# Patient Record
Sex: Male | Born: 1950 | Race: Black or African American | Hispanic: No | Marital: Married | Smoking: Former smoker
Health system: Southern US, Community
[De-identification: ages and names within clinical notes are randomized; demographics above are authoritative.]

## PROBLEM LIST (undated history)

## (undated) DIAGNOSIS — I200 Unstable angina: Secondary | ICD-10-CM

## (undated) DIAGNOSIS — F1910 Other psychoactive substance abuse, uncomplicated: Secondary | ICD-10-CM

## (undated) DIAGNOSIS — R079 Chest pain, unspecified: Secondary | ICD-10-CM

## (undated) DIAGNOSIS — I10 Essential (primary) hypertension: Secondary | ICD-10-CM

## (undated) DIAGNOSIS — R0602 Shortness of breath: Secondary | ICD-10-CM

## (undated) DIAGNOSIS — I219 Acute myocardial infarction, unspecified: Secondary | ICD-10-CM

## (undated) DIAGNOSIS — K219 Gastro-esophageal reflux disease without esophagitis: Secondary | ICD-10-CM

## (undated) DIAGNOSIS — Z87891 Personal history of nicotine dependence: Secondary | ICD-10-CM

## (undated) DIAGNOSIS — S73015A Posterior dislocation of left hip, initial encounter: Secondary | ICD-10-CM

## (undated) DIAGNOSIS — E785 Hyperlipidemia, unspecified: Secondary | ICD-10-CM

## (undated) DIAGNOSIS — S9305XA Dislocation of left ankle joint, initial encounter: Secondary | ICD-10-CM

## (undated) HISTORY — DX: Chest pain, unspecified: R07.9

## (undated) HISTORY — DX: Essential (primary) hypertension: I10

## (undated) HISTORY — PX: TONSILLECTOMY: SUR1361

## (undated) HISTORY — DX: Personal history of nicotine dependence: Z87.891

## (undated) HISTORY — DX: Other psychoactive substance abuse, uncomplicated: F19.10

## (undated) HISTORY — DX: Unstable angina: I20.0

## (undated) HISTORY — DX: Gastro-esophageal reflux disease without esophagitis: K21.9

## (undated) HISTORY — DX: Shortness of breath: R06.02

## (undated) HISTORY — DX: Hyperlipidemia, unspecified: E78.5

---

## 2006-07-21 DIAGNOSIS — I219 Acute myocardial infarction, unspecified: Secondary | ICD-10-CM

## 2006-07-21 HISTORY — DX: Acute myocardial infarction, unspecified: I21.9

## 2010-04-12 ENCOUNTER — Encounter: Payer: Self-pay | Admitting: Physician Assistant

## 2010-04-12 ENCOUNTER — Encounter: Payer: Self-pay | Admitting: Cardiology

## 2010-04-12 ENCOUNTER — Ambulatory Visit: Payer: Self-pay | Admitting: Internal Medicine

## 2010-04-12 ENCOUNTER — Inpatient Hospital Stay (HOSPITAL_COMMUNITY): Admission: EM | Admit: 2010-04-12 | Discharge: 2010-04-16 | Payer: Self-pay | Admitting: Cardiology

## 2010-04-13 ENCOUNTER — Encounter: Payer: Self-pay | Admitting: Cardiology

## 2010-04-16 ENCOUNTER — Encounter: Payer: Self-pay | Admitting: Cardiology

## 2010-04-19 ENCOUNTER — Encounter: Payer: Self-pay | Admitting: Cardiology

## 2010-04-22 DIAGNOSIS — R0602 Shortness of breath: Secondary | ICD-10-CM

## 2010-04-22 DIAGNOSIS — Z87891 Personal history of nicotine dependence: Secondary | ICD-10-CM

## 2010-04-22 DIAGNOSIS — R079 Chest pain, unspecified: Secondary | ICD-10-CM

## 2010-04-23 ENCOUNTER — Ambulatory Visit: Payer: Self-pay | Admitting: Cardiology

## 2010-04-23 DIAGNOSIS — I10 Essential (primary) hypertension: Secondary | ICD-10-CM | POA: Insufficient documentation

## 2010-04-26 ENCOUNTER — Encounter (INDEPENDENT_AMBULATORY_CARE_PROVIDER_SITE_OTHER): Payer: Self-pay | Admitting: *Deleted

## 2010-05-06 ENCOUNTER — Telehealth (INDEPENDENT_AMBULATORY_CARE_PROVIDER_SITE_OTHER): Payer: Self-pay | Admitting: *Deleted

## 2010-08-20 NOTE — Consult Note (Signed)
Summary: CARDIOLOGY CONSULT/ [facility]  CARDIOLOGY CONSULT/ [facility]   Imported By: Zachary George 04/22/2010 16:11:16  _____________________________________________________________________  External Attachment:    Type:   Image     Comment:   External Document

## 2010-08-20 NOTE — Assessment & Plan Note (Signed)
Summary: [facility]-POST [facility] PER STUCKEY REQUEST   Visit Type:  Follow-up Primary Provider:  none   History of Present Illness: recently seen at [facility] with substernal chest pain. The patient ruled in for a non-ST elevation myocardial infarction and was referred for cardiac catheterization. Normal coronary arteries were demonstrated. The patient had a negative d-dimer and had normal LV function. He did have a history of polysubstance abuse including cocaine and had a negative urine drug screen.  The patient presents for follow. He denies any chest pain. He could have had coronary spasm. Denies any melanotic meds she is here.his blood pressure is poorly controlled. He was started on lisinopril which is probably not a good choice of an [place] American male. Otherwise he has done well in followup. Denies any palpitations syncope orthopnea PND.  Preventive Screening-Counseling & Management  Alcohol-Tobacco     Smoking Status: quit     Year Started: 1965     Year Quit: 07/29/2009     Pack years: 1 PPD x's 45 years  Current Medications (verified): 1)  Amlodipine Besylate 5 Mg Tabs (Amlodipine Besylate) .... Take 1 Tablet By Mouth Once A Day 2)  Aspirin 325 Mg Tabs (Aspirin) .... Take 1 Tablet By Mouth Once A Day 3)  Crestor 20 Mg Tabs (Rosuvastatin Calcium) .... Take 1 Tablet By Mouth Once A Day 4)  Vitamin B-12 500 Mcg Tabs (Cyanocobalamin) .... Take 2 Tablet By Mouth Once A Day 5)  Chlorthalidone 25 Mg Tabs (Chlorthalidone) .... Take 1 Tablet By Mouth Once A Day 6)  Nitrostat 0.4 Mg Subl (Nitroglycerin) .... Dissolve One Tablet Under Tongue For Severe Chest Pain As Needed Every 5 Minutes, Not To Exceed 3 in 15 Min Time Frame  Allergies (verified): 1)  ! * Nitroglycerin  Comments:  Nurse/Medical Assistant: The patient's medication list and allergies were reviewed with the patient and were updated in the Medication and Allergy Lists.  Past History:  Past Medical History: G E R  D Hyperlipidemia Hypertension Polysubstance abuse (tobacco, alcohol and cocaine) cardiac catheterization April 15, 2010 normal coronary arteries and normal LV function.  Status post non-ST elevation microinfarction, possibly coronary spasm Asymptomatic bradycardia  Social History: Smoking Status:  quit Pack years:  1 PPD x's 45 years  Vital Signs:  Patient profile:   60 year old male Height:      69 inches Weight:      190 pounds BMI:     28.16 Pulse rate:   59 / minute BP sitting:   145 / 90  (left arm) Cuff size:   large  Vitals Entered By: Carlye Grippe (April 23, 2010 10:01 AM)  Nutrition Counseling: Patient's BMI is greater than 25 and therefore counseled on weight management options.  Physical Exam  Additional Exam:  General: Well-developed, well-nourished in no distress head: Normocephalic and atraumatic eyes PERRLA/EOMI intact, conjunctiva and lids normal nose: No deformity or lesions mouth normal dentition, normal posterior pharynx neck: Supple, no JVD.  No masses, thyromegaly or abnormal cervical nodes lungs: Normal breath sounds bilaterally without wheezing.  Normal percussion heart: regular rate and rhythm with normal S1 and S2, no S3 or S4.  PMI is normal.  No pathological murmurs abdomen: Normal bowel sounds, abdomen is soft and nontender without masses, organomegaly or hernias noted.  No hepatosplenomegaly musculoskeletal: Back normal, normal gait muscle strength and tone normal pulsus: Pulse is normal in all 4 extremities Extremities: No peripheral pitting edema neurologic: Alert and oriented x 3 skin: Intact  without lesions or rashes cervical nodes: No significant adenopathy psychologic: Normal affect    Impression & Recommendations:  Problem # 1:  CHEST PAIN UNSPECIFIED (ICD-786.50) status post non-ST elevation myocardial infarction with normal coronary arteries possibly coronary spasm. DC metoprolol and DC'd lisinopril. Start amlodipine 5 mg  p.o. q. daily. P.r.n. sublingual nitroglycerin The following medications were removed from the medication list:    Lisinopril 5 Mg Tabs (Lisinopril) .Marland Kitchen... Take 1 tablet by mouth once a day    Metoprolol Tartrate 25 Mg Tabs (Metoprolol tartrate) .Marland Kitchen... Take 1 tablet by mouth two times a day His updated medication list for this problem includes:    Amlodipine Besylate 5 Mg Tabs (Amlodipine besylate) .Marland Kitchen... Take 1 tablet by mouth once a day    Aspirin 325 Mg Tabs (Aspirin) .Marland Kitchen... Take 1 tablet by mouth once a day    Nitrostat 0.4 Mg Subl (Nitroglycerin) .Marland Kitchen... Dissolve one tablet under tongue for severe chest pain as needed every 5 minutes, not to exceed 3 in 15 min time frame  Problem # 2:  HYPERTENSION NEC (ICD-997.91) we will add chlorthalidone 25-milligrams p.o.daily.in addition to amlodipine.  Problem # 3:  SHORTNESS OF BREATH (ICD-786.05) possibly hypertensive heart disease. Medication adjustments as outlined above. The following medications were removed from the medication list:    Lisinopril 5 Mg Tabs (Lisinopril) .Marland Kitchen... Take 1 tablet by mouth once a day    Metoprolol Tartrate 25 Mg Tabs (Metoprolol tartrate) .Marland Kitchen... Take 1 tablet by mouth two times a day His updated medication list for this problem includes:    Amlodipine Besylate 5 Mg Tabs (Amlodipine besylate) .Marland Kitchen... Take 1 tablet by mouth once a day    Aspirin 325 Mg Tabs (Aspirin) .Marland Kitchen... Take 1 tablet by mouth once a day    Chlorthalidone 25 Mg Tabs (Chlorthalidone) .Marland Kitchen... Take 1 tablet by mouth once a day  Patient Instructions: 1)  Stop Metoprolol 2)  Stop Lisinopril  3)  Begin Amlodipine 5mg  daily 4)  Chlorthalidone 25mg  daily 5)  Nitroglycerin as needed for severe chest pain  6)  Follow up in  6 months Prescriptions: NITROSTAT 0.4 MG SUBL (NITROGLYCERIN) dissolve one tablet under tongue for severe chest pain as needed every 5 minutes, not to exceed 3 in 15 min time frame  #25 x 3   Entered by:   Hoover Brunette, LPN   Authorized by:    Lewayne Bunting, MD, [facility]   Signed by:   Hoover Brunette, LPN on 16/04/9603   Method used:   Electronically to        [facility] (retail)       [address]       [place], [place]  [address]       Ph: [phone]       Fax: [phone]   RxID:   [id number] CHLORTHALIDONE 25 MG TABS (CHLORTHALIDONE) Take 1 tablet by mouth once a day  #30 x 6   Entered by:   Hoover Brunette, LPN   Authorized by:   Lewayne Bunting, MD, [facility]   Signed by:   Hoover Brunette, LPN on 05/17/2535   Method used:   Electronically to        [facility] (retail)       [address]       [place], [place]  [address]       Ph: [phone]  Fax: [phone]   RxID:   [id number] AMLODIPINE BESYLATE 5 MG TABS (AMLODIPINE BESYLATE) Take 1 tablet by mouth once a day  #30 x 6   Entered by:   Hoover Brunette, LPN   Authorized by:   Lewayne Bunting, MD, [facility]   Signed by:   Hoover Brunette, LPN on 30/86/5784   Method used:   Electronically to        [facility] (retail)       [address]       [place], [place]  [address]       Ph: [phone]       Fax: [phone]   RxID:   [id number]

## 2010-08-20 NOTE — Progress Notes (Signed)
Summary: change crestor to cheaper med    Phone Note Other Incoming   Caller: fax - [facility] Summary of Call: Patient requesting something cheaper than Crestor 20mg  at bedtime. Hoover Brunette, LPN  May 06, 2010 9:19 AM    Follow-up for Phone Call        Pravachol 40mg  Po qhs Follow-up by: Lewayne Bunting, MD, [facility],  May 10, 2010 5:56 AM  Additional Follow-up for Phone Call Additional follow up Details #1::        No answer.  New rx sent to pharm.   Hoover Brunette, LPN  May 10, 2010 4:03 PM     New/Updated Medications: PRAVACHOL 40 MG TABS (PRAVASTATIN SODIUM) Take 1 tab by mouth at bedtime Prescriptions: PRAVACHOL 40 MG TABS (PRAVASTATIN SODIUM) Take 1 tab by mouth at bedtime  #30 x 6   Entered by:   Hoover Brunette, LPN   Authorized by:   Lewayne Bunting, MD, [facility]   Signed by:   Hoover Brunette, LPN on 16/04/9603   Method used:   Electronically to        [facility] (retail)       [address]       [place], [place]  [address]       Ph: [phone]       Fax: [phone]   RxID:   [id number]

## 2010-08-20 NOTE — Letter (Signed)
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 [address]   [place], [place] [address]   Phone: [phone]  Fax: [phone]    04/26/2010  TO: Leodis Sias IT MAY CONCERN   RE: Bryan Levy [address] [id number]   The above named individual is under my medical care and may return to work on: Monday, October 10, 201l without any restrictions.    If you have any further questions or need additional information, please call.     Sincerely,    Hoover Brunette, LPN

## 2010-09-05 ENCOUNTER — Encounter: Payer: Self-pay | Admitting: Cardiology

## 2010-09-26 NOTE — Letter (Signed)
Summary: External Correspondence/ [facility]  External Correspondence/ [facility]   Imported By: Dorise Hiss 09/18/2010 11:49:44  _____________________________________________________________________  External Attachment:    Type:   Image     Comment:   External Document

## 2010-10-03 LAB — CARDIAC PANEL(CRET KIN+CKTOT+MB+TROPI)
CK, MB: 1.1 ng/mL (ref 0.3–4.0)
Relative Index: INVALID (ref 0.0–2.5)
Relative Index: INVALID (ref 0.0–2.5)
Total CK: 65 U/L (ref 7–232)
Total CK: 76 U/L (ref 7–232)
Troponin I: 0.84 ng/mL (ref 0.00–0.06)
Troponin I: 0.86 ng/mL (ref 0.00–0.06)

## 2010-10-03 LAB — DRUGS OF ABUSE SCREEN W/O ALC, ROUTINE URINE
Amphetamine Screen, Ur: NEGATIVE
Barbiturate Quant, Ur: NEGATIVE
Creatinine,U: 75 mg/dL
Methadone: NEGATIVE
Phencyclidine (PCP): NEGATIVE

## 2010-10-03 LAB — PROTIME-INR: INR: 1.05 (ref 0.00–1.49)

## 2010-10-03 LAB — BASIC METABOLIC PANEL
BUN: 12 mg/dL (ref 6–23)
CO2: 27 mEq/L (ref 19–32)
CO2: 28 mEq/L (ref 19–32)
CO2: 32 mEq/L (ref 19–32)
Calcium: 8.4 mg/dL (ref 8.4–10.5)
Calcium: 8.7 mg/dL (ref 8.4–10.5)
Chloride: 104 mEq/L (ref 96–112)
Chloride: 108 mEq/L (ref 96–112)
Creatinine, Ser: 1.02 mg/dL (ref 0.4–1.5)
GFR calc Af Amer: >60 mL/min (ref >60)
GFR calc Af Amer: >60 mL/min (ref >60)
GFR calc Af Amer: >60 mL/min (ref >60)
Glucose, Bld: 107 mg/dL — ABNORMAL HIGH (ref 70–99)
Glucose, Bld: 99 mg/dL (ref 70–99)
Potassium: 4.2 mEq/L (ref 3.5–5.1)
Potassium: 4.2 mEq/L (ref 3.5–5.1)
Sodium: 141 mEq/L (ref 135–145)

## 2010-10-03 LAB — CBC
HCT: 39.5 % (ref 39.0–52.0)
HCT: 40.3 % (ref 39.0–52.0)
HCT: 41 % (ref 39.0–52.0)
Hemoglobin: 13 g/dL (ref 13.0–17.0)
Hemoglobin: 13.5 g/dL (ref 13.0–17.0)
Hemoglobin: 13.7 g/dL (ref 13.0–17.0)
Hemoglobin: 13.7 g/dL (ref 13.0–17.0)
MCH: 29.1 pg (ref 26.0–34.0)
MCH: 29.2 pg (ref 26.0–34.0)
MCH: 29.3 pg (ref 26.0–34.0)
MCHC: 33.9 g/dL (ref 30.0–36.0)
MCHC: 34.2 g/dL (ref 30.0–36.0)
MCV: 86.3 fL (ref 78.0–100.0)
MCV: 86.9 fL (ref 78.0–100.0)
Platelets: 202 10*3/uL (ref 150–400)
RBC: 4.64 MIL/uL (ref 4.22–5.81)
RBC: 4.67 MIL/uL (ref 4.22–5.81)
RBC: 4.72 MIL/uL (ref 4.22–5.81)
RDW: 12.9 % (ref 11.5–15.5)
RDW: 13.2 % (ref 11.5–15.5)
WBC: 5.2 10*3/uL (ref 4.0–10.5)
WBC: 7.1 10*3/uL (ref 4.0–10.5)

## 2010-10-03 LAB — LIPID PANEL: Cholesterol: 121 mg/dL (ref 0–200)

## 2010-10-03 LAB — HEPARIN LEVEL (UNFRACTIONATED)
Heparin Unfractionated: 0.22 IU/mL — ABNORMAL LOW (ref 0.30–0.70)
Heparin Unfractionated: 0.33 IU/mL (ref 0.30–0.70)
Heparin Unfractionated: 0.41 IU/mL (ref 0.30–0.70)

## 2010-10-03 LAB — D-DIMER, QUANTITATIVE: D-Dimer, Quant: <0.22 ug/mL-FEU (ref 0.00–0.48)

## 2010-10-03 LAB — APTT: aPTT: 93 seconds — ABNORMAL HIGH (ref 24–37)

## 2010-12-02 ENCOUNTER — Encounter: Payer: Self-pay | Admitting: *Deleted

## 2010-12-03 ENCOUNTER — Encounter: Payer: Self-pay | Admitting: *Deleted

## 2010-12-04 ENCOUNTER — Encounter: Payer: Self-pay | Admitting: Cardiology

## 2010-12-04 ENCOUNTER — Ambulatory Visit (INDEPENDENT_AMBULATORY_CARE_PROVIDER_SITE_OTHER): Payer: Private Health Insurance - Indemnity | Admitting: Physician Assistant

## 2010-12-04 DIAGNOSIS — I200 Unstable angina: Secondary | ICD-10-CM

## 2010-12-04 DIAGNOSIS — E785 Hyperlipidemia, unspecified: Secondary | ICD-10-CM

## 2010-12-04 DIAGNOSIS — IMO0002 Reserved for concepts with insufficient information to code with codable children: Secondary | ICD-10-CM

## 2010-12-04 DIAGNOSIS — I249 Acute ischemic heart disease, unspecified: Secondary | ICD-10-CM | POA: Insufficient documentation

## 2010-12-04 DIAGNOSIS — Z87891 Personal history of nicotine dependence: Secondary | ICD-10-CM

## 2010-12-04 MED ORDER — ASPIRIN EC 81 MG PO TBEC: 81.0000 mg | DELAYED_RELEASE_TABLET | Freq: Every day | ORAL | Status: AC

## 2010-12-04 NOTE — Progress Notes (Signed)
HPI: 60 year old male followup of coronary disease. In September of 2011 the patient presented with chest pain. The patient ruled in for a non-ST elevation myocardial infarction and was referred for cardiac catheterization. Normal coronary arteries were demonstrated. The patient had a negative d-dimer and had normal LV function. He did have a history of polysubstance abuse including cocaine and had a negative urine drug screen. Patient also treated for hypertension. Last seen by Dr. Andee Lineman in Oct 2011. Since then, he denies any interim development of exertional angina pectoris. Unfortunately, he conitinues to smoke, but had successfully stopped on his own, several years ago.  Current Outpatient Prescriptions  Medication Sig Dispense Refill  . amLODipine (NORVASC) 5 MG tablet Take 5 mg by mouth daily.        Marland Kitchen aspirin 325 MG tablet Take 325 mg by mouth daily.        . chlorthalidone (HYGROTON) 25 MG tablet Take 25 mg by mouth daily.        . cyanocobalamin (BL VITAMIN B-12) 500 MCG tablet Take 1,000 mcg by mouth daily.        . nitroGLYCERIN (NITROSTAT) 0.4 MG SL tablet Place 0.4 mg under the tongue every 5 (five) minutes as needed.        . pravastatin (PRAVACHOL) 40 MG tablet Take 40 mg by mouth daily.           Past Medical History  Diagnosis Date  . GERD (gastroesophageal reflux disease)   . Hyperlipidemia   . Hypertension   . Polysubstance abuse   . Chest pain, unspecified   . Intermediate coronary syndrome   . Personal history of tobacco use, presenting hazards to health   . Shortness of breath     No past surgical history on file.  History   Social History  . Marital Status: Married    Spouse Name: N/A    Number of Children: N/A  . Years of Education: N/A   Occupational History  . Not on file.   Social History Main Topics  . Smoking status: Former Smoker -- 1.0 packs/day    Types: Cigarettes    Quit date: 07/29/2009  . Smokeless tobacco: Not on file   Comment:  Pack  years: 1 PPD x's 45 years  . Alcohol Use: Yes     Polysubstance abuse (tobacco, alcohol and cocaine)  . Drug Use: Yes     Polysubstance abuse (tobacco, alcohol and cocaine)  . Sexually Active: Not on file   Other Topics Concern  . Not on file   Social History Narrative   Polysubstance abuse (tobacco, alcohol and cocaine) In the past    ROS: no fevers or chills, productive cough, hemoptysis, dysphasia, odynophagia, melena, hematochezia, dysuria, hematuria, rash, seizure activity, orthopnea, PND, pedal edema, claudication. Remaining systems are negative.  Physical Exam: Well-developed well-nourished in no acute distress.  Skin is warm and dry.  HEENT is normal.  Neck is supple. No thyromegaly.  Chest is clear to auscultation with normal expansion.  Cardiovascular exam is regular rate and rhythm.  Abdominal exam nontender or distended. No masses palpated. Extremities show no edema. neuro grossly intact  ECG

## 2010-12-04 NOTE — Assessment & Plan Note (Signed)
Emphasized the importance of smoking cessation. Pt has quit in past, and is willing to try again on his own.

## 2010-12-04 NOTE — Progress Notes (Signed)
   Patient ID: Bryan Levy, male    DOB: [birth year], 60 y.o.   MRN: [id number]  HPI    Review of Systems    Physical Exam

## 2010-12-04 NOTE — Patient Instructions (Signed)
   Decrease Aspirin to 81mg  daily  Stop Tobacco Your physician recommends that you go to the [facility] for lab work for fasting lipid panel & liver function test. Reminder:  Nothing to eat or drink after 12 midnight prior to labs. Your physician wants you to follow up in:  1 year.  You will receive a reminder letter in the mail one-two months in advance.  If you don't receive a letter, please call our office to schedule the follow up appointment

## 2010-12-04 NOTE — Assessment & Plan Note (Signed)
Continue current medication regimen. No current indication for repeat ischemic evaluation. We'll schedule a return visit with Dr. Andee Lineman in one year, or sooner if needed.

## 2010-12-04 NOTE — Assessment & Plan Note (Signed)
We'll reassess lipid status with FLP/LFT profile. Target LDL 70 or less, if feasible. 

## 2010-12-04 NOTE — Assessment & Plan Note (Signed)
Well-controlled on current medication regimen 

## 2011-08-06 DIAGNOSIS — R079 Chest pain, unspecified: Secondary | ICD-10-CM

## 2011-08-08 ENCOUNTER — Telehealth: Payer: Self-pay | Admitting: *Deleted

## 2011-08-08 NOTE — Telephone Encounter (Signed)
Nurse called and left message on nurse's voicemail that she needs confirmation that patient can return to work without restrictions on 08/12/11. Please advise.

## 2011-08-11 NOTE — Telephone Encounter (Signed)
Jasmine Awe notified of below.

## 2011-08-11 NOTE — Telephone Encounter (Signed)
Yes absolutely- patient can return without restrictions on that day. He has NO CAD.

## 2016-07-21 HISTORY — PX: ESOPHAGOGASTRODUODENOSCOPY: SHX1529

## 2016-09-09 ENCOUNTER — Emergency Department (HOSPITAL_COMMUNITY): Payer: Managed Care, Other (non HMO)

## 2016-09-09 ENCOUNTER — Inpatient Hospital Stay (HOSPITAL_COMMUNITY)
Admission: EM | Admit: 2016-09-09 | Discharge: 2016-09-16 | DRG: 958 | Disposition: A | Discharge status: Home Health | Payer: Managed Care, Other (non HMO) | Attending: General Surgery | Admitting: General Surgery

## 2016-09-09 ENCOUNTER — Encounter (HOSPITAL_COMMUNITY): Payer: Self-pay | Admitting: Emergency Medicine

## 2016-09-09 DIAGNOSIS — Y92410 Unspecified street and highway as the place of occurrence of the external cause: Secondary | ICD-10-CM | POA: Diagnosis not present

## 2016-09-09 DIAGNOSIS — S9305XA Dislocation of left ankle joint, initial encounter: Secondary | ICD-10-CM | POA: Diagnosis present

## 2016-09-09 DIAGNOSIS — Z79899 Other long term (current) drug therapy: Secondary | ICD-10-CM | POA: Diagnosis not present

## 2016-09-09 DIAGNOSIS — Z888 Allergy status to other drugs, medicaments and biological substances status: Secondary | ICD-10-CM | POA: Diagnosis not present

## 2016-09-09 DIAGNOSIS — S066X0A Traumatic subarachnoid hemorrhage without loss of consciousness, initial encounter: Secondary | ICD-10-CM | POA: Diagnosis present

## 2016-09-09 DIAGNOSIS — S32423A Displaced fracture of posterior wall of unspecified acetabulum, initial encounter for closed fracture: Secondary | ICD-10-CM

## 2016-09-09 DIAGNOSIS — M9689 Other intraoperative and postprocedural complications and disorders of the musculoskeletal system: Secondary | ICD-10-CM | POA: Diagnosis not present

## 2016-09-09 DIAGNOSIS — S32422A Displaced fracture of posterior wall of left acetabulum, initial encounter for closed fracture: Principal | ICD-10-CM | POA: Diagnosis present

## 2016-09-09 DIAGNOSIS — I609 Nontraumatic subarachnoid hemorrhage, unspecified: Secondary | ICD-10-CM

## 2016-09-09 DIAGNOSIS — Z23 Encounter for immunization: Secondary | ICD-10-CM | POA: Diagnosis not present

## 2016-09-09 DIAGNOSIS — S73015A Posterior dislocation of left hip, initial encounter: Secondary | ICD-10-CM | POA: Diagnosis present

## 2016-09-09 DIAGNOSIS — D62 Acute posthemorrhagic anemia: Secondary | ICD-10-CM | POA: Diagnosis present

## 2016-09-09 DIAGNOSIS — S32422S Displaced fracture of posterior wall of left acetabulum, sequela: Secondary | ICD-10-CM | POA: Diagnosis not present

## 2016-09-09 DIAGNOSIS — S82832A Other fracture of upper and lower end of left fibula, initial encounter for closed fracture: Secondary | ICD-10-CM | POA: Diagnosis present

## 2016-09-09 DIAGNOSIS — S32409S Unspecified fracture of unspecified acetabulum, sequela: Secondary | ICD-10-CM | POA: Diagnosis not present

## 2016-09-09 DIAGNOSIS — Z9889 Other specified postprocedural states: Secondary | ICD-10-CM

## 2016-09-09 DIAGNOSIS — Z419 Encounter for procedure for purposes other than remedying health state, unspecified: Secondary | ICD-10-CM

## 2016-09-09 DIAGNOSIS — S065X9A Traumatic subdural hemorrhage with loss of consciousness of unspecified duration, initial encounter: Secondary | ICD-10-CM | POA: Diagnosis present

## 2016-09-09 DIAGNOSIS — S81012A Laceration without foreign body, left knee, initial encounter: Secondary | ICD-10-CM | POA: Diagnosis not present

## 2016-09-09 DIAGNOSIS — S069X0S Unspecified intracranial injury without loss of consciousness, sequela: Secondary | ICD-10-CM | POA: Diagnosis not present

## 2016-09-09 DIAGNOSIS — S161XXA Strain of muscle, fascia and tendon at neck level, initial encounter: Secondary | ICD-10-CM | POA: Diagnosis present

## 2016-09-09 DIAGNOSIS — Z8249 Family history of ischemic heart disease and other diseases of the circulatory system: Secondary | ICD-10-CM | POA: Diagnosis not present

## 2016-09-09 DIAGNOSIS — S32402A Unspecified fracture of left acetabulum, initial encounter for closed fracture: Secondary | ICD-10-CM

## 2016-09-09 DIAGNOSIS — I252 Old myocardial infarction: Secondary | ICD-10-CM | POA: Diagnosis not present

## 2016-09-09 DIAGNOSIS — F17210 Nicotine dependence, cigarettes, uncomplicated: Secondary | ICD-10-CM | POA: Diagnosis present

## 2016-09-09 DIAGNOSIS — M879 Osteonecrosis, unspecified: Secondary | ICD-10-CM | POA: Diagnosis present

## 2016-09-09 DIAGNOSIS — Z510 Encounter for antineoplastic radiation therapy: Secondary | ICD-10-CM | POA: Diagnosis present

## 2016-09-09 DIAGNOSIS — M6140 Other calcification of muscle, unspecified site: Secondary | ICD-10-CM | POA: Diagnosis not present

## 2016-09-09 DIAGNOSIS — S82892A Other fracture of left lower leg, initial encounter for closed fracture: Secondary | ICD-10-CM

## 2016-09-09 DIAGNOSIS — T148XXA Other injury of unspecified body region, initial encounter: Secondary | ICD-10-CM

## 2016-09-09 DIAGNOSIS — M542 Cervicalgia: Secondary | ICD-10-CM

## 2016-09-09 DIAGNOSIS — X58XXXS Exposure to other specified factors, sequela: Secondary | ICD-10-CM | POA: Diagnosis not present

## 2016-09-09 HISTORY — DX: Posterior dislocation of left hip, initial encounter: S73.015A

## 2016-09-09 HISTORY — DX: Acute myocardial infarction, unspecified: I21.9

## 2016-09-09 HISTORY — DX: Dislocation of left ankle joint, initial encounter: S93.05XA

## 2016-09-09 LAB — COMPREHENSIVE METABOLIC PANEL
ALT: 40 U/L (ref 17–63)
AST: 43 U/L — ABNORMAL HIGH (ref 15–41)
Albumin: 3.8 g/dL (ref 3.5–5.0)
Alkaline Phosphatase: 89 U/L (ref 38–126)
Anion gap: 9 (ref 5–15)
BUN: 10 mg/dL (ref 6–20)
CO2: 26 mmol/L (ref 22–32)
Calcium: 8.8 mg/dL — ABNORMAL LOW (ref 8.9–10.3)
Chloride: 104 mmol/L (ref 101–111)
Creatinine, Ser: 1.02 mg/dL (ref 0.61–1.24)
GFR calc Af Amer: >60 mL/min (ref >60)
GFR calc non Af Amer: >60 mL/min (ref >60)
Glucose, Bld: 129 mg/dL — ABNORMAL HIGH (ref 65–99)
Potassium: 3.6 mmol/L (ref 3.5–5.1)
Sodium: 139 mmol/L (ref 135–145)
Total Bilirubin: 0.8 mg/dL (ref 0.3–1.2)
Total Protein: 6.2 g/dL — ABNORMAL LOW (ref 6.5–8.1)

## 2016-09-09 LAB — I-STAT CHEM 8, ED
BUN: 13 mg/dL (ref 6–20)
Calcium, Ion: 1.13 mmol/L — ABNORMAL LOW (ref 1.15–1.40)
Chloride: 101 mmol/L (ref 101–111)
Creatinine, Ser: 0.9 mg/dL (ref 0.61–1.24)
Glucose, Bld: 125 mg/dL — ABNORMAL HIGH (ref 65–99)
HCT: 42 % (ref 39.0–52.0)
Hemoglobin: 14.3 g/dL (ref 13.0–17.0)
Potassium: 3.6 mmol/L (ref 3.5–5.1)
Sodium: 140 mmol/L (ref 135–145)
TCO2: 29 mmol/L (ref 0–100)

## 2016-09-09 LAB — CBC
HCT: 42 % (ref 39.0–52.0)
Hemoglobin: 14.1 g/dL (ref 13.0–17.0)
MCH: 30.7 pg (ref 26.0–34.0)
MCHC: 33.6 g/dL (ref 30.0–36.0)
MCV: 91.5 fL (ref 78.0–100.0)
PLATELETS: 260 10*3/uL (ref 150–400)
RBC: 4.59 MIL/uL (ref 4.22–5.81)
RDW: 13.5 % (ref 11.5–15.5)
WBC: 7.7 10*3/uL (ref 4.0–10.5)

## 2016-09-09 LAB — URINALYSIS, ROUTINE W REFLEX MICROSCOPIC
Bilirubin Urine: NEGATIVE
Glucose, UA: NEGATIVE mg/dL
Hgb urine dipstick: NEGATIVE
Ketones, ur: 20 mg/dL — AB
Leukocytes, UA: NEGATIVE
Nitrite: NEGATIVE
Protein, ur: NEGATIVE mg/dL
Specific Gravity, Urine: 1.023 (ref 1.005–1.030)
pH: 7 (ref 5.0–8.0)

## 2016-09-09 LAB — SAMPLE TO BLOOD BANK

## 2016-09-09 LAB — PROTIME-INR
INR: 1.1
Prothrombin Time: 14.2 seconds (ref 11.4–15.2)

## 2016-09-09 LAB — I-STAT CG4 LACTIC ACID, ED: Lactic Acid, Venous: 1.19 mmol/L (ref 0.5–1.9)

## 2016-09-09 LAB — CDS SEROLOGY

## 2016-09-09 LAB — ETHANOL: Alcohol, Ethyl (B): <5 mg/dL (ref <5)

## 2016-09-09 MED ORDER — PROPOFOL 10 MG/ML IV BOLUS
1.0000 mg/kg | Freq: Once | INTRAVENOUS | Status: AC
  Administered 2016-09-09: 41.5 mg via INTRAVENOUS

## 2016-09-09 MED ORDER — HYDROMORPHONE HCL 2 MG/ML IJ SOLN
INTRAMUSCULAR | Status: AC
  Filled 2016-09-09: qty 1

## 2016-09-09 MED ORDER — KETAMINE HCL 10 MG/ML IJ SOLN: 1.0000 mg/kg | Freq: Once | INTRAMUSCULAR | Status: DC

## 2016-09-09 MED ORDER — TETANUS-DIPHTH-ACELL PERTUSSIS 5-2.5-18.5 LF-MCG/0.5 IM SUSP
0.5000 mL | Freq: Once | INTRAMUSCULAR | Status: AC
  Administered 2016-09-09: 0.5 mL via INTRAMUSCULAR
  Filled 2016-09-09: qty 0.5

## 2016-09-09 MED ORDER — ONDANSETRON HCL 4 MG/2ML IJ SOLN
INTRAMUSCULAR | Status: AC
  Filled 2016-09-09: qty 2

## 2016-09-09 MED ORDER — PROPOFOL 10 MG/ML IV BOLUS
INTRAVENOUS | Status: AC
  Filled 2016-09-09: qty 20

## 2016-09-09 MED ORDER — IOPAMIDOL (ISOVUE-300) INJECTION 61%
INTRAVENOUS | Status: AC
  Administered 2016-09-09: 100 mL
  Filled 2016-09-09: qty 100

## 2016-09-09 MED ORDER — FENTANYL CITRATE (PF) 100 MCG/2ML IJ SOLN
INTRAMUSCULAR | Status: AC
  Administered 2016-09-09: 100 ug
  Filled 2016-09-09: qty 2

## 2016-09-09 MED ORDER — KETAMINE HCL-SODIUM CHLORIDE 100-0.9 MG/10ML-% IV SOSY
1.0000 mg/kg | PREFILLED_SYRINGE | Freq: Once | INTRAVENOUS | Status: AC
  Administered 2016-09-09: 83 mg via INTRAVENOUS

## 2016-09-09 MED ORDER — ONDANSETRON HCL 4 MG/2ML IJ SOLN
4.0000 mg | Freq: Once | INTRAMUSCULAR | Status: AC
  Administered 2016-09-09: 4 mg via INTRAVENOUS

## 2016-09-09 MED ORDER — KETAMINE HCL-SODIUM CHLORIDE 100-0.9 MG/10ML-% IV SOSY
PREFILLED_SYRINGE | INTRAVENOUS | Status: AC
  Filled 2016-09-09: qty 10

## 2016-09-09 MED ORDER — HYDROMORPHONE HCL 2 MG/ML IJ SOLN
1.0000 mg | Freq: Once | INTRAMUSCULAR | Status: AC
  Administered 2016-09-09: 1 mg via INTRAVENOUS

## 2016-09-09 MED ORDER — FENTANYL CITRATE (PF) 100 MCG/2ML IJ SOLN
150.0000 ug | Freq: Once | INTRAMUSCULAR | Status: AC
  Administered 2016-09-09: 200 ug via INTRAVENOUS
  Filled 2016-09-09: qty 4

## 2016-09-09 MED ORDER — ONDANSETRON HCL 4 MG/2ML IJ SOLN
4.0000 mg | Freq: Once | INTRAMUSCULAR | Status: AC
  Administered 2016-09-09: 4 mg via INTRAVENOUS
  Filled 2016-09-09: qty 2

## 2016-09-09 MED ORDER — HYDRALAZINE HCL 20 MG/ML IJ SOLN
20.0000 mg | Freq: Once | INTRAMUSCULAR | Status: AC
  Administered 2016-09-09: 20 mg via INTRAVENOUS
  Filled 2016-09-09: qty 1
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
## 2016-09-09 NOTE — Progress Notes (Signed)
Orthopedic Tech Progress Note Patient Details:  Donaciano EvaCurtis E Pettie [birth year] [id number]  Ortho Devices Type of Ortho Device: Ace wrap, Post (short leg) splint, Stirrup splint Ortho Device/Splint Location: LLE Ortho Device/Splint Interventions: Ordered, Application   Jennye MoccasinHughes, Mailey Landstrom Craig 09/09/2016, 9:01 PM

## 2016-09-09 NOTE — ED Notes (Signed)
Pt attempting to use urinal at this time.  Pt remains alert and oriented at this time.  Pt rates pain #8 on pain scale 0/10
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
## 2016-09-09 NOTE — ED Notes (Signed)
Dr Duda at bedside. 

## 2016-09-09 NOTE — ED Notes (Signed)
Pt returned from CT, Wife and son at bedside.  Dr. Charm BargesButler in talking with family
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
## 2016-09-09 NOTE — ED Notes (Signed)
Port x-ray complete.  Strong pedal pulse palpable in left foot.  No relief from pain med.  Pt continues to rate pain #10 on pain scale 0/10.  Pt remains alert and oriented.

## 2016-09-09 NOTE — ED Notes (Signed)
Pt to CT at this time.

## 2016-09-09 NOTE — ED Notes (Signed)
Dilaudid 1mg  IV verbal ordered from Dr. Charm BargesButler

## 2016-09-09 NOTE — ED Notes (Signed)
Dr. Charm BargesButler and Dr. Corlis LeakMackuen at bedside reducing pts foot

## 2016-09-09 NOTE — ED Notes (Signed)
Pt vomited large amount of brown liquid.

## 2016-09-10 ENCOUNTER — Inpatient Hospital Stay (HOSPITAL_COMMUNITY): Payer: Managed Care, Other (non HMO)

## 2016-09-10 ENCOUNTER — Ambulatory Visit: Admit: 2016-09-10 | Payer: Managed Care, Other (non HMO) | Admitting: Radiation Oncology

## 2016-09-10 LAB — BASIC METABOLIC PANEL
Anion gap: 8 (ref 5–15)
BUN: 8 mg/dL (ref 6–20)
CALCIUM: 8.6 mg/dL — AB (ref 8.9–10.3)
CO2: 23 mmol/L (ref 22–32)
CREATININE: 0.84 mg/dL (ref 0.61–1.24)
Chloride: 103 mmol/L (ref 101–111)
GFR calc non Af Amer: >60 mL/min (ref >60)
GLUCOSE: 157 mg/dL — AB (ref 65–99)
Potassium: 3.9 mmol/L (ref 3.5–5.1)
Sodium: 134 mmol/L — ABNORMAL LOW (ref 135–145)

## 2016-09-10 LAB — CBC
HEMATOCRIT: 38.7 % — AB (ref 39.0–52.0)
Hemoglobin: 12.9 g/dL — ABNORMAL LOW (ref 13.0–17.0)
MCH: 30.1 pg (ref 26.0–34.0)
MCHC: 33.3 g/dL (ref 30.0–36.0)
MCV: 90.4 fL (ref 78.0–100.0)
Platelets: 218 10*3/uL (ref 150–400)
RBC: 4.28 MIL/uL (ref 4.22–5.81)
RDW: 13.7 % (ref 11.5–15.5)
WBC: 12.5 10*3/uL — ABNORMAL HIGH (ref 4.0–10.5)

## 2016-09-10 LAB — HIV ANTIBODY (ROUTINE TESTING W REFLEX): HIV Screen 4th Generation wRfx: NONREACTIVE

## 2016-09-10 LAB — MRSA PCR SCREENING: MRSA by PCR: NEGATIVE

## 2016-09-10 MED ORDER — HYDRALAZINE HCL 20 MG/ML IJ SOLN: 20.0000 mg | INTRAMUSCULAR | Status: DC | PRN

## 2016-09-10 MED ORDER — MORPHINE SULFATE (PF) 2 MG/ML IV SOLN
2.0000 mg | INTRAVENOUS | Status: DC | PRN
  Administered 2016-09-10 – 2016-09-11 (×5): 2 mg via INTRAVENOUS
  Filled 2016-09-10 (×5): qty 1

## 2016-09-10 MED ORDER — MIDAZOLAM HCL 2 MG/2ML IJ SOLN
2.0000 mg | Freq: Once | INTRAMUSCULAR | Status: AC
  Administered 2016-09-10: 2 mg via INTRAVENOUS

## 2016-09-10 MED ORDER — MIDAZOLAM HCL 2 MG/2ML IJ SOLN
INTRAMUSCULAR | Status: AC
  Filled 2016-09-10: qty 2

## 2016-09-10 MED ORDER — FENTANYL CITRATE (PF) 100 MCG/2ML IJ SOLN
50.0000 ug | Freq: Once | INTRAMUSCULAR | Status: AC
  Administered 2016-09-10: 50 ug via INTRAVENOUS

## 2016-09-10 MED ORDER — ACETAMINOPHEN 325 MG PO TABS: 650.0000 mg | ORAL_TABLET | ORAL | Status: DC | PRN

## 2016-09-10 MED ORDER — SODIUM CHLORIDE 0.9 % IV SOLN
INTRAVENOUS | Status: DC
  Administered 2016-09-10: 01:00:00 via INTRAVENOUS

## 2016-09-10 MED ORDER — FENTANYL CITRATE (PF) 100 MCG/2ML IJ SOLN
INTRAMUSCULAR | Status: AC
  Filled 2016-09-10: qty 2

## 2016-09-10 MED ORDER — ONDANSETRON HCL 4 MG/2ML IJ SOLN
4.0000 mg | Freq: Four times a day (QID) | INTRAMUSCULAR | Status: DC | PRN
Start: 2016-09-10 — End: 2016-09-11

## 2016-09-10 MED ORDER — ONDANSETRON HCL 4 MG PO TABS: 4.0000 mg | ORAL_TABLET | Freq: Four times a day (QID) | ORAL | Status: DC | PRN

## 2016-09-10 MED ORDER — LABETALOL HCL 5 MG/ML IV SOLN
10.0000 mg | INTRAVENOUS | Status: DC | PRN
  Filled 2016-09-10: qty 4

## 2016-09-10 MED ORDER — HYDROMORPHONE HCL 1 MG/ML IJ SOLN
1.0000 mg | INTRAMUSCULAR | Status: DC | PRN
Start: 2016-09-10 — End: 2016-09-11
  Administered 2016-09-10 – 2016-09-11 (×3): 1 mg via INTRAVENOUS
  Filled 2016-09-10 (×3): qty 1

## 2016-09-10 MED ORDER — CEFAZOLIN SODIUM-DEXTROSE 2-4 GM/100ML-% IV SOLN
2.0000 g | INTRAVENOUS | Status: AC
  Filled 2016-09-10: qty 100

## 2016-09-10 NOTE — Progress Notes (Signed)
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 He will go to the OR tomorrow for left ORIF and we will plan to see him for formal consultation on Friday in our department.     Bryan MasonAlison C. Ansley Mangiapane, PAC
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
## 2016-09-10 NOTE — Progress Notes (Signed)
Orthopedic Tech Progress Note Patient Details:  Bryan Levy Sep 18, 1950 [id number]  Musculoskeletal Traction Type of Traction: Skeletal (Balanced Suspension) Traction Location: lle Traction Weight: 20 lbs Applied 20lb skeletal traction to lle as per drs verbal order.   Trinna PostMartinez, Linet Brash J 09/10/2016, 3:27 AM
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
## 2016-09-11 ENCOUNTER — Inpatient Hospital Stay (HOSPITAL_COMMUNITY): Payer: Managed Care, Other (non HMO) | Admitting: Certified Registered Nurse Anesthetist

## 2016-09-11 ENCOUNTER — Encounter (HOSPITAL_COMMUNITY): Payer: Self-pay | Admitting: Anesthesiology

## 2016-09-11 ENCOUNTER — Inpatient Hospital Stay (HOSPITAL_COMMUNITY): Payer: Managed Care, Other (non HMO)

## 2016-09-11 ENCOUNTER — Encounter (HOSPITAL_COMMUNITY): Admission: EM | Disposition: A | Discharge status: Home Health | Payer: Self-pay | Source: Home / Self Care

## 2016-09-11 ENCOUNTER — Telehealth: Payer: Self-pay | Admitting: *Deleted

## 2016-09-11 HISTORY — PX: ORIF ANKLE FRACTURE: SHX5408

## 2016-09-11 HISTORY — PX: IRRIGATION AND DEBRIDEMENT KNEE: SHX5185

## 2016-09-11 HISTORY — PX: ORIF ACETABULAR FRACTURE: SHX5029

## 2016-09-11 LAB — CBC
HCT: 35.9 % — ABNORMAL LOW (ref 39.0–52.0)
Hemoglobin: 12 g/dL — ABNORMAL LOW (ref 13.0–17.0)
MCH: 30.2 pg (ref 26.0–34.0)
MCHC: 33.4 g/dL (ref 30.0–36.0)
MCV: 90.4 fL (ref 78.0–100.0)
PLATELETS: 213 10*3/uL (ref 150–400)
RBC: 3.97 MIL/uL — AB (ref 4.22–5.81)
RDW: 13.6 % (ref 11.5–15.5)
WBC: 8.6 10*3/uL (ref 4.0–10.5)

## 2016-09-11 LAB — BASIC METABOLIC PANEL
Anion gap: 9 (ref 5–15)
BUN: 10 mg/dL (ref 6–20)
CALCIUM: 8.8 mg/dL — AB (ref 8.9–10.3)
CO2: 26 mmol/L (ref 22–32)
CREATININE: 0.79 mg/dL (ref 0.61–1.24)
Chloride: 100 mmol/L — ABNORMAL LOW (ref 101–111)
GFR calc Af Amer: >60 mL/min (ref >60)
GLUCOSE: 108 mg/dL — AB (ref 65–99)
POTASSIUM: 4 mmol/L (ref 3.5–5.1)
SODIUM: 135 mmol/L (ref 135–145)

## 2016-09-11 LAB — TYPE AND SCREEN
ABO/RH(D): A POS
ANTIBODY SCREEN: NEGATIVE

## 2016-09-11 LAB — PROTIME-INR
INR: 1.09
PROTHROMBIN TIME: 14.2 s (ref 11.4–15.2)

## 2016-09-11 LAB — ABO/RH: ABO/RH(D): A POS

## 2016-09-11 SURGERY — OPEN REDUCTION INTERNAL FIXATION (ORIF) ACETABULAR FRACTURE
Anesthesia: General | Site: Knee | Laterality: Left

## 2016-09-11 MED ORDER — OXYCODONE HCL 5 MG PO TABS: 5.0000 mg | ORAL_TABLET | ORAL | Status: DC | PRN

## 2016-09-11 MED ORDER — PHENYLEPHRINE 40 MCG/ML (10ML) SYRINGE FOR IV PUSH (FOR BLOOD PRESSURE SUPPORT)
PREFILLED_SYRINGE | INTRAVENOUS | Status: AC
Start: 2016-09-11 — End: 2016-09-11
  Filled 2016-09-11: qty 10

## 2016-09-11 MED ORDER — MIDAZOLAM HCL 2 MG/2ML IJ SOLN
INTRAMUSCULAR | Status: DC | PRN
  Administered 2016-09-11: 2 mg via INTRAVENOUS

## 2016-09-11 MED ORDER — EPHEDRINE SULFATE-NACL 50-0.9 MG/10ML-% IV SOSY
PREFILLED_SYRINGE | INTRAVENOUS | Status: DC | PRN
  Administered 2016-09-11: 15 mg via INTRAVENOUS

## 2016-09-11 MED ORDER — LEVETIRACETAM 500 MG PO TABS
500.0000 mg | ORAL_TABLET | Freq: Two times a day (BID) | ORAL | Status: DC
  Administered 2016-09-11 – 2016-09-16 (×10): 500 mg via ORAL
  Filled 2016-09-11 (×10): qty 1

## 2016-09-11 MED ORDER — ONDANSETRON HCL 4 MG PO TABS: 4.0000 mg | ORAL_TABLET | Freq: Four times a day (QID) | ORAL | Status: DC | PRN

## 2016-09-11 MED ORDER — CEFAZOLIN IN D5W 1 GM/50ML IV SOLN
1.0000 g | Freq: Four times a day (QID) | INTRAVENOUS | Status: AC
  Administered 2016-09-11 – 2016-09-12 (×3): 1 g via INTRAVENOUS
  Filled 2016-09-11 (×4): qty 50

## 2016-09-11 MED ORDER — OXYCODONE HCL 5 MG PO TABS
5.0000 mg | ORAL_TABLET | ORAL | Status: DC | PRN
  Administered 2016-09-11: 10 mg via ORAL
  Administered 2016-09-12 – 2016-09-16 (×10): 15 mg via ORAL
  Filled 2016-09-11 (×3): qty 3
  Filled 2016-09-11: qty 2
  Filled 2016-09-11 (×7): qty 3

## 2016-09-11 MED ORDER — ONDANSETRON HCL 4 MG/2ML IJ SOLN
4.0000 mg | Freq: Four times a day (QID) | INTRAMUSCULAR | Status: DC | PRN
Start: 2016-09-11 — End: 2016-09-16

## 2016-09-11 MED ORDER — ACETAMINOPHEN 325 MG PO TABS: 650.0000 mg | ORAL_TABLET | Freq: Four times a day (QID) | ORAL | Status: DC | PRN

## 2016-09-11 MED ORDER — SUGAMMADEX SODIUM 200 MG/2ML IV SOLN
INTRAVENOUS | Status: DC | PRN
  Administered 2016-09-11: 200 mg via INTRAVENOUS

## 2016-09-11 MED ORDER — CEFAZOLIN SODIUM 1 G IJ SOLR
INTRAMUSCULAR | Status: DC | PRN
  Administered 2016-09-11 (×2): 2 g via INTRAMUSCULAR

## 2016-09-11 MED ORDER — EPHEDRINE 5 MG/ML INJ
INTRAVENOUS | Status: AC
  Filled 2016-09-11: qty 20

## 2016-09-11 MED ORDER — ACETAMINOPHEN 10 MG/ML IV SOLN
INTRAVENOUS | Status: DC | PRN
  Administered 2016-09-11: 1000 mg via INTRAVENOUS

## 2016-09-11 MED ORDER — POLYETHYLENE GLYCOL 3350 17 G PO PACK
17.0000 g | PACK | Freq: Every day | ORAL | Status: DC | PRN
  Administered 2016-09-14: 17 g via ORAL
  Filled 2016-09-11: qty 1

## 2016-09-11 MED ORDER — CEFAZOLIN SODIUM 1 G IJ SOLR
INTRAMUSCULAR | Status: AC
  Filled 2016-09-11: qty 20

## 2016-09-11 MED ORDER — PROPOFOL 10 MG/ML IV BOLUS
INTRAVENOUS | Status: DC | PRN
  Administered 2016-09-11: 150 mg via INTRAVENOUS
  Administered 2016-09-11: 50 mg via INTRAVENOUS

## 2016-09-11 MED ORDER — LIDOCAINE 2% (20 MG/ML) 5 ML SYRINGE
INTRAMUSCULAR | Status: AC
  Filled 2016-09-11: qty 10

## 2016-09-11 MED ORDER — LIDOCAINE 2% (20 MG/ML) 5 ML SYRINGE
INTRAMUSCULAR | Status: DC | PRN
Start: 2016-09-11 — End: 2016-09-11
  Administered 2016-09-11: 60 mg via INTRAVENOUS

## 2016-09-11 MED ORDER — FENTANYL CITRATE (PF) 100 MCG/2ML IJ SOLN
INTRAMUSCULAR | Status: DC | PRN
  Administered 2016-09-11: 150 ug via INTRAVENOUS
  Administered 2016-09-11 (×3): 50 ug via INTRAVENOUS

## 2016-09-11 MED ORDER — ONDANSETRON HCL 4 MG/2ML IJ SOLN
INTRAMUSCULAR | Status: AC
  Filled 2016-09-11: qty 2

## 2016-09-11 MED ORDER — ONDANSETRON HCL 4 MG/2ML IJ SOLN
INTRAMUSCULAR | Status: DC | PRN
  Administered 2016-09-11: 4 mg via INTRAVENOUS

## 2016-09-11 MED ORDER — ROCURONIUM BROMIDE 10 MG/ML (PF) SYRINGE
PREFILLED_SYRINGE | INTRAVENOUS | Status: DC | PRN
  Administered 2016-09-11: 50 mg via INTRAVENOUS
  Administered 2016-09-11: 20 mg via INTRAVENOUS
  Administered 2016-09-11: 30 mg via INTRAVENOUS

## 2016-09-11 MED ORDER — FENTANYL CITRATE (PF) 100 MCG/2ML IJ SOLN
INTRAMUSCULAR | Status: AC
  Filled 2016-09-11: qty 4

## 2016-09-11 MED ORDER — LACTATED RINGERS IV SOLN
INTRAVENOUS | Status: DC
  Administered 2016-09-11: 10:00:00 via INTRAVENOUS

## 2016-09-11 MED ORDER — LACTATED RINGERS IV SOLN
INTRAVENOUS | Status: DC | PRN
  Administered 2016-09-11 (×3): via INTRAVENOUS

## 2016-09-11 MED ORDER — METOCLOPRAMIDE HCL 5 MG PO TABS: 5.0000 mg | ORAL_TABLET | Freq: Three times a day (TID) | ORAL | Status: DC | PRN

## 2016-09-11 MED ORDER — ACETAMINOPHEN 650 MG RE SUPP: 650.0000 mg | Freq: Four times a day (QID) | RECTAL | Status: DC | PRN

## 2016-09-11 MED ORDER — MIDAZOLAM HCL 2 MG/2ML IJ SOLN
INTRAMUSCULAR | Status: AC
  Filled 2016-09-11: qty 2

## 2016-09-11 MED ORDER — ROCURONIUM BROMIDE 50 MG/5ML IV SOSY
PREFILLED_SYRINGE | INTRAVENOUS | Status: AC
  Filled 2016-09-11: qty 15

## 2016-09-11 MED ORDER — PHENYLEPHRINE HCL 10 MG/ML IJ SOLN
INTRAMUSCULAR | Status: DC | PRN
  Administered 2016-09-11: 20 ug/min via INTRAVENOUS

## 2016-09-11 MED ORDER — ARTIFICIAL TEARS OP OINT
TOPICAL_OINTMENT | OPHTHALMIC | Status: AC
  Filled 2016-09-11: qty 7

## 2016-09-11 MED ORDER — METHOCARBAMOL 1000 MG/10ML IJ SOLN
1000.0000 mg | Freq: Four times a day (QID) | INTRAVENOUS | Status: DC | PRN
  Filled 2016-09-11: qty 10

## 2016-09-11 MED ORDER — PNEUMOCOCCAL VAC POLYVALENT 25 MCG/0.5ML IJ INJ
0.5000 mL | INJECTION | INTRAMUSCULAR | Status: AC
  Administered 2016-09-12: 0.5 mL via INTRAMUSCULAR
  Filled 2016-09-11: qty 0.5

## 2016-09-11 MED ORDER — KETAMINE HCL-SODIUM CHLORIDE 100-0.9 MG/10ML-% IV SOSY
PREFILLED_SYRINGE | INTRAVENOUS | Status: AC
  Filled 2016-09-11: qty 10

## 2016-09-11 MED ORDER — SUGAMMADEX SODIUM 500 MG/5ML IV SOLN
INTRAVENOUS | Status: AC
  Filled 2016-09-11: qty 5

## 2016-09-11 MED ORDER — HYDROMORPHONE HCL 1 MG/ML IJ SOLN
1.0000 mg | INTRAMUSCULAR | Status: DC | PRN
  Administered 2016-09-11: 1 mg via INTRAVENOUS
  Administered 2016-09-12 (×2): 2 mg via INTRAVENOUS
  Administered 2016-09-12 (×3): 1 mg via INTRAVENOUS
  Administered 2016-09-12 – 2016-09-13 (×2): 2 mg via INTRAVENOUS
  Filled 2016-09-11 (×2): qty 1
  Filled 2016-09-11 (×2): qty 2
  Filled 2016-09-11: qty 1
  Filled 2016-09-11 (×2): qty 2
  Filled 2016-09-11: qty 1

## 2016-09-11 MED ORDER — 0.9 % SODIUM CHLORIDE (POUR BTL) OPTIME
TOPICAL | Status: DC | PRN
  Administered 2016-09-11 (×2): 1000 mL

## 2016-09-11 MED ORDER — DOCUSATE SODIUM 100 MG PO CAPS
100.0000 mg | ORAL_CAPSULE | Freq: Two times a day (BID) | ORAL | Status: DC
  Administered 2016-09-12 – 2016-09-16 (×9): 100 mg via ORAL
  Filled 2016-09-11 (×10): qty 1

## 2016-09-11 MED ORDER — LIDOCAINE 2% (20 MG/ML) 5 ML SYRINGE
INTRAMUSCULAR | Status: AC
  Filled 2016-09-11: qty 5

## 2016-09-11 MED ORDER — KETAMINE HCL 10 MG/ML IJ SOLN
INTRAMUSCULAR | Status: DC | PRN
  Administered 2016-09-11: 30 mg via INTRAVENOUS
  Administered 2016-09-11: 20 mg via INTRAVENOUS
  Administered 2016-09-11: 50 mg via INTRAVENOUS

## 2016-09-11 MED ORDER — PROPOFOL 10 MG/ML IV BOLUS
INTRAVENOUS | Status: AC
  Filled 2016-09-11: qty 20

## 2016-09-11 MED ORDER — ESMOLOL HCL 100 MG/10ML IV SOLN
INTRAVENOUS | Status: DC | PRN
  Administered 2016-09-11: 20 mg via INTRAVENOUS

## 2016-09-11 MED ORDER — ALBUMIN HUMAN 5 % IV SOLN
INTRAVENOUS | Status: DC | PRN
  Administered 2016-09-11: 12:00:00 via INTRAVENOUS

## 2016-09-11 MED ORDER — HYDROMORPHONE HCL 1 MG/ML IJ SOLN: 0.2500 mg | INTRAMUSCULAR | Status: DC | PRN

## 2016-09-11 MED ORDER — MEPERIDINE HCL 25 MG/ML IJ SOLN: 6.2500 mg | INTRAMUSCULAR | Status: DC | PRN

## 2016-09-11 MED ORDER — OXYCODONE-ACETAMINOPHEN 5-325 MG PO TABS: 1.0000 | ORAL_TABLET | Freq: Four times a day (QID) | ORAL | Status: DC | PRN

## 2016-09-11 MED ORDER — SODIUM CHLORIDE 0.9 % IJ SOLN
INTRAMUSCULAR | Status: AC
  Filled 2016-09-11: qty 20

## 2016-09-11 MED ORDER — PHENYLEPHRINE 40 MCG/ML (10ML) SYRINGE FOR IV PUSH (FOR BLOOD PRESSURE SUPPORT)
PREFILLED_SYRINGE | INTRAVENOUS | Status: DC | PRN
  Administered 2016-09-11 (×2): 80 ug via INTRAVENOUS
  Administered 2016-09-11: 120 ug via INTRAVENOUS
  Administered 2016-09-11 (×3): 80 ug via INTRAVENOUS
  Administered 2016-09-11: 40 ug via INTRAVENOUS
  Administered 2016-09-11 (×2): 80 ug via INTRAVENOUS

## 2016-09-11 MED ORDER — PROMETHAZINE HCL 25 MG/ML IJ SOLN: 6.2500 mg | INTRAMUSCULAR | Status: DC | PRN

## 2016-09-11 MED ORDER — METOCLOPRAMIDE HCL 5 MG/ML IJ SOLN: 5.0000 mg | Freq: Three times a day (TID) | INTRAMUSCULAR | Status: DC | PRN

## 2016-09-11 MED ORDER — DEXAMETHASONE SODIUM PHOSPHATE 10 MG/ML IJ SOLN
INTRAMUSCULAR | Status: AC
  Filled 2016-09-11: qty 3

## 2016-09-11 MED ORDER — METHOCARBAMOL 500 MG PO TABS
500.0000 mg | ORAL_TABLET | Freq: Four times a day (QID) | ORAL | Status: DC | PRN
  Administered 2016-09-12 – 2016-09-15 (×3): 500 mg via ORAL
  Administered 2016-09-15 – 2016-09-16 (×3): 1000 mg via ORAL
  Filled 2016-09-11 (×3): qty 1
  Filled 2016-09-11 (×3): qty 2

## 2016-09-11 MED ORDER — ROCURONIUM BROMIDE 50 MG/5ML IV SOSY
PREFILLED_SYRINGE | INTRAVENOUS | Status: AC
  Filled 2016-09-11: qty 10

## 2016-09-11 MED ORDER — DEXAMETHASONE SODIUM PHOSPHATE 10 MG/ML IJ SOLN
INTRAMUSCULAR | Status: DC | PRN
  Administered 2016-09-11: 10 mg via INTRAVENOUS

## 2016-09-11 SURGICAL SUPPLY — 106 items
BANDAGE ELASTIC 4 VELCRO ST LF (GAUZE/BANDAGES/DRESSINGS) ×5 IMPLANT
BANDAGE ELASTIC 6 VELCRO ST LF (GAUZE/BANDAGES/DRESSINGS) ×5 IMPLANT
BIT DRILL 2 CANN GRADUATED (BIT) ×5 IMPLANT
BIT DRILL 2.5 CANN STRL (BIT) ×5 IMPLANT
BIT DRILL AO MATTA 2.5MX230M (BIT) ×3 IMPLANT
BIT DRILL STEP 3.5 (DRILL) IMPLANT
BIT DRILL TWST MATTA 3.5MX195M (BIT) ×3 IMPLANT
BLADE SURG 10 STRL SS (BLADE) ×5 IMPLANT
BLADE SURG ROTATE 9660 (MISCELLANEOUS) IMPLANT
BNDG GAUZE ELAST 4 BULKY (GAUZE/BANDAGES/DRESSINGS) ×10 IMPLANT
BONE CANC CHIPS 20CC PCAN1/4 (Bone Implant) ×5 IMPLANT
BRUSH SCRUB DISP (MISCELLANEOUS) ×10 IMPLANT
CHIPS CANC BONE 20CC PCAN1/4 (Bone Implant) ×3 IMPLANT
CLOSURE WOUND 1/2 X4 (GAUZE/BANDAGES/DRESSINGS)
COVER SURGICAL LIGHT HANDLE (MISCELLANEOUS) ×5 IMPLANT
DRAIN PENROSE 18X1/4 LTX STRL (WOUND CARE) ×5 IMPLANT
DRAPE C-ARM 42X72 X-RAY (DRAPES) ×5 IMPLANT
DRAPE C-ARMOR (DRAPES) IMPLANT
DRAPE INCISE IOBAN 66X45 STRL (DRAPES) ×5 IMPLANT
DRAPE INCISE IOBAN 85X60 (DRAPES) ×5 IMPLANT
DRAPE ORTHO SPLIT 77X108 STRL (DRAPES) ×4
DRAPE SURG ORHT 6 SPLT 77X108 (DRAPES) ×6 IMPLANT
DRAPE U-SHAPE 47X51 STRL (DRAPES) ×5 IMPLANT
DRILL BIT AO MATTA 2.5MX230M (BIT) ×5
DRILL STEP 3.5 (DRILL)
DRILL TWIST AO MATTA 3.5MX195M (BIT) ×5
DRSG MEPILEX BORDER 4X12 (GAUZE/BANDAGES/DRESSINGS) ×5 IMPLANT
DRSG MEPILEX BORDER 4X8 (GAUZE/BANDAGES/DRESSINGS) IMPLANT
DRSG MEPITEL 4X7.2 (GAUZE/BANDAGES/DRESSINGS) ×10 IMPLANT
DRSG PAD ABDOMINAL 8X10 ST (GAUZE/BANDAGES/DRESSINGS) ×5 IMPLANT
ELECT BLADE 6.5 EXT (BLADE) IMPLANT
ELECT REM PT RETURN 9FT ADLT (ELECTROSURGICAL) ×5
ELECTRODE REM PT RTRN 9FT ADLT (ELECTROSURGICAL) ×3 IMPLANT
GAUZE SPONGE 4X4 12PLY STRL (GAUZE/BANDAGES/DRESSINGS) ×10 IMPLANT
GLOVE BIO SURGEON STRL SZ7.5 (GLOVE) ×5 IMPLANT
GLOVE BIO SURGEON STRL SZ8 (GLOVE) ×5 IMPLANT
GLOVE BIOGEL PI IND STRL 7.5 (GLOVE) ×3 IMPLANT
GLOVE BIOGEL PI IND STRL 8 (GLOVE) ×3 IMPLANT
GLOVE BIOGEL PI INDICATOR 7.5 (GLOVE) ×2
GLOVE BIOGEL PI INDICATOR 8 (GLOVE) ×2
GOWN STRL REUS W/ TWL LRG LVL3 (GOWN DISPOSABLE) ×6 IMPLANT
GOWN STRL REUS W/ TWL XL LVL3 (GOWN DISPOSABLE) ×6 IMPLANT
GOWN STRL REUS W/TWL LRG LVL3 (GOWN DISPOSABLE) ×4
GOWN STRL REUS W/TWL XL LVL3 (GOWN DISPOSABLE) ×4
GUIDEWIRE 1.35MM (WIRE) ×5 IMPLANT
HANDPIECE INTERPULSE COAX TIP (DISPOSABLE) ×2
KIT BASIN OR (CUSTOM PROCEDURE TRAY) ×5 IMPLANT
KIT ROOM TURNOVER OR (KITS) ×5 IMPLANT
MANIFOLD NEPTUNE II (INSTRUMENTS) ×5 IMPLANT
NS IRRIG 1000ML POUR BTL (IV SOLUTION) ×10 IMPLANT
PACK TOTAL JOINT (CUSTOM PROCEDURE TRAY) ×5 IMPLANT
PAD ARMBOARD 7.5X6 YLW CONV (MISCELLANEOUS) ×15 IMPLANT
PADDING CAST ABS 6INX4YD NS (CAST SUPPLIES) ×6
PADDING CAST ABS COTTON 6X4 NS (CAST SUPPLIES) ×9 IMPLANT
PADDING CAST COTTON 6X4 STRL (CAST SUPPLIES) ×10 IMPLANT
PENCIL BUTTON HOLSTER BLD 10FT (ELECTRODE) ×5 IMPLANT
PIN APEX 6X180MM EXFIX (EXFIX) ×5 IMPLANT
PLATE ACET STRT 94.5M 8H (Plate) ×10 IMPLANT
PLATE DISTAL FIBULA 4H LOCKING (Plate) ×5 IMPLANT
RETRIEVER SUT HEWSON (MISCELLANEOUS) ×5 IMPLANT
SCREW CANCELLOUS 3X16MM (Screw) ×5 IMPLANT
SCREW CORT 2.5XFT 44X3.5XST (Screw) ×3 IMPLANT
SCREW CORT 48X3.5XST NS LF (Screw) ×3 IMPLANT
SCREW CORTEX ST MATTA 3.5X22MM (Screw) ×5 IMPLANT
SCREW CORTEX ST MATTA 3.5X26MM (Screw) ×5 IMPLANT
SCREW CORTEX ST MATTA 3.5X28MM (Screw) ×5 IMPLANT
SCREW CORTEX ST MATTA 3.5X30MM (Screw) ×5 IMPLANT
SCREW CORTEX ST MATTA 3.5X32MM (Screw) ×5 IMPLANT
SCREW CORTEX ST MATTA 3.5X36MM (Screw) ×5 IMPLANT
SCREW CORTEX ST MATTA 3.5X38M (Screw) ×5 IMPLANT
SCREW CORTEX ST MATTA 3.5X40MM (Screw) ×5 IMPLANT
SCREW CORTICAL 3.5X42MM (Screw) ×5 IMPLANT
SCREW CORTICAL 3.5X44MM (Screw) ×2 IMPLANT
SCREW CORTICAL 3.5X48MM (Screw) ×2 IMPLANT
SCREW LOCK T10 FT 18X2.7X (Screw) ×6 IMPLANT
SCREW LOCKING 2.7X12 ANKLE (Screw) ×5 IMPLANT
SCREW LOCKING 2.7X18MM (Screw) ×4 IMPLANT
SCREW LOW PROFILE 3.5X16 (Screw) ×10 IMPLANT
SCREW NLOCK T15 FT 18X3.5XST (Screw) ×3 IMPLANT
SCREW NON LOCK 3.5X18MM (Screw) ×2 IMPLANT
SCREW NON-LOCKING 3.5X20 ANKLE (Screw) ×5 IMPLANT
SET HNDPC FAN SPRY TIP SCT (DISPOSABLE) ×3 IMPLANT
SPLINT PLASTER CAST XFAST 5X30 (CAST SUPPLIES) ×3 IMPLANT
SPLINT PLASTER XFAST SET 5X30 (CAST SUPPLIES) ×2
SPONGE LAP 18X18 X RAY DECT (DISPOSABLE) ×15 IMPLANT
STAPLER VISISTAT 35W (STAPLE) ×5 IMPLANT
STRIP CLOSURE SKIN 1/2X4 (GAUZE/BANDAGES/DRESSINGS) IMPLANT
SUCTION FRAZIER HANDLE 10FR (MISCELLANEOUS) ×2
SUCTION TUBE FRAZIER 10FR DISP (MISCELLANEOUS) ×3 IMPLANT
SUT ETHILON 2 0 PSLX (SUTURE) ×10 IMPLANT
SUT ETHILON 3 0 PS 1 (SUTURE) ×10 IMPLANT
SUT FIBERWIRE #2 38 T-5 BLUE (SUTURE) ×10
SUT PDS AB 2-0 CT1 27 (SUTURE) ×5 IMPLANT
SUT VIC AB 0 CT1 27 (SUTURE) ×4
SUT VIC AB 0 CT1 27XBRD ANBCTR (SUTURE) ×6 IMPLANT
SUT VIC AB 1 CT1 18XCR BRD 8 (SUTURE) ×3 IMPLANT
SUT VIC AB 1 CT1 27 (SUTURE) ×2
SUT VIC AB 1 CT1 27XBRD ANBCTR (SUTURE) ×3 IMPLANT
SUT VIC AB 1 CT1 8-18 (SUTURE) ×2
SUT VIC AB 2-0 CT1 27 (SUTURE) ×6
SUT VIC AB 2-0 CT1 TAPERPNT 27 (SUTURE) ×9 IMPLANT
SUTURE FIBERWR #2 38 T-5 BLUE (SUTURE) ×6 IMPLANT
TOWEL OR 17X24 6PK STRL BLUE (TOWEL DISPOSABLE) ×10 IMPLANT
TOWEL OR 17X26 10 PK STRL BLUE (TOWEL DISPOSABLE) ×10 IMPLANT
TRAY FOLEY CATH 16FRSI W/METER (SET/KITS/TRAYS/PACK) ×5 IMPLANT
WATER STERILE IRR 1000ML POUR (IV SOLUTION) IMPLANT

## 2016-09-11 NOTE — Anesthesia Procedure Notes (Signed)
Procedure Name: Intubation Date/Time: 09/11/2016 10:38 AM Performed by: Mervyn Gay Pre-anesthesia Checklist: Patient identified, Patient being monitored, Timeout performed, Emergency Drugs available and Suction available Patient Re-evaluated:Patient Re-evaluated prior to inductionOxygen Delivery Method: Circle System Utilized Preoxygenation: Pre-oxygenation with 100% oxygen Intubation Type: IV induction Ventilation: Mask ventilation without difficulty Laryngoscope Size: 3, Glidescope and Mac Grade View: Grade I Tube type: Oral Tube size: 8.0 mm Number of attempts: 1 Airway Equipment and Method: Stylet and Video-laryngoscopy Placement Confirmation: ETT inserted through vocal cords under direct vision,  positive ETCO2 and breath sounds checked- equal and bilateral Secured at: 21 cm Tube secured with: Tape Dental Injury: Teeth and Oropharynx as per pre-operative assessment  Comments: Head in strict neutral alignment throughout procedure. Anterior portion of soft collar removed to facilitate intubation. Head stabilized throughout procedure. After intubation, anterior soft collar was resecured.

## 2016-09-11 NOTE — Anesthesia Preprocedure Evaluation (Addendum)
Anesthesia Evaluation  Patient identified by MRN, date of birth, ID band Patient awake    Reviewed: Allergy & Precautions, NPO status , Patient's Chart, lab work & pertinent test results  History of Anesthesia Complications (+) AWARENESS UNDER ANESTHESIA  Airway Mallampati: IV  TM Distance: >3 FB Neck ROM: Limited  Mouth opening: Limited Mouth Opening Comment: In hard cervical collar Dental  (+) Missing, Loose, Poor Dentition, Dental Advisory Given,    Pulmonary Current Smoker,    Pulmonary exam normal breath sounds clear to auscultation       Cardiovascular negative cardio ROS Normal cardiovascular exam Rhythm:Regular Rate:Normal     Neuro/Psych CT Cervical Spine: Anterior spurring throughout the cervical spine most prominent C4-5 and C5-6. Disc degeneration and posterior spurring throughout the cervical spine. Mild spinal stenosis C4-5, C5-6, and C6-7. Right foraminal narrowing C5-6 and C6-7 due to spurring.  C-spine cleared negative psych ROS   GI/Hepatic negative GI ROS, Neg liver ROS,   Endo/Other  negative endocrine ROS  Renal/GU negative Renal ROS  negative genitourinary   Musculoskeletal Fx Left Acetabulum Fx/dislocation left ankle- reduced   Abdominal   Peds  Hematology  (+) anemia ,   Anesthesia Other Findings   Reproductive/Obstetrics                            Anesthesia Physical Anesthesia Plan  ASA: II  Anesthesia Plan: General   Post-op Pain Management:    Induction: Intravenous  Airway Management Planned: Oral ETT and Video Laryngoscope Planned  Additional Equipment:   Intra-op Plan:   Post-operative Plan: Extubation in OR  Informed Consent: I have reviewed the patients History and Physical, chart, labs and discussed the procedure including the risks, benefits and alternatives for the proposed anesthesia with the patient or authorized representative who has  indicated his/her understanding and acceptance.   Dental advisory given  Plan Discussed with: Anesthesiologist, CRNA and Surgeon  Anesthesia Plan Comments:         Anesthesia Quick Evaluation
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
## 2016-09-11 NOTE — Transfer of Care (Signed)
Immediate Anesthesia Transfer of Care Note  Patient: Bryan Levy  Procedure(s) Performed: Procedure(s): OPEN REDUCTION INTERNAL FIXATION (ORIF) ACETABULAR FRACTURE (Left) IRRIGATION AND DEBRIDEMENT LEFT KNEE (Left) OPEN REDUCTION INTERNAL FIXATION (ORIF) LEFT ANKLE FRACTURE (Left)  Patient Location: PACU  Anesthesia Type:General  Level of Consciousness: awake, alert  and oriented  Airway & Oxygen Therapy: Patient Spontanous Breathing and Patient connected to nasal cannula oxygen  Post-op Assessment: Report given to RN and Post -op Vital signs reviewed and stable  Post vital signs: Reviewed and stable  Last Vitals:  Vitals:   09/11/16 0900 09/11/16 1510  BP: (!) 154/95 140/87  Pulse: 70 (!) 114  Resp: 18 20  Temp:  36.7 C    Last Pain:  Vitals:   09/11/16 0809  TempSrc:   PainSc: Asleep      Patients Stated Pain Goal: 2 (09/10/16 0500)  Complications: No apparent anesthesia complications
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
## 2016-09-12 ENCOUNTER — Ambulatory Visit
Admit: 2016-09-12 | Discharge: 2016-09-12 | Disposition: A | Discharge status: Home / Self Care | Payer: Managed Care, Other (non HMO) | Attending: Radiation Oncology | Admitting: Radiation Oncology

## 2016-09-12 ENCOUNTER — Telehealth: Payer: Self-pay | Admitting: *Deleted

## 2016-09-12 ENCOUNTER — Encounter (HOSPITAL_COMMUNITY): Payer: Self-pay | Admitting: Orthopedic Surgery

## 2016-09-12 ENCOUNTER — Inpatient Hospital Stay (HOSPITAL_COMMUNITY): Payer: Managed Care, Other (non HMO)

## 2016-09-12 DIAGNOSIS — Z888 Allergy status to other drugs, medicaments and biological substances status: Secondary | ICD-10-CM | POA: Insufficient documentation

## 2016-09-12 DIAGNOSIS — Z510 Encounter for antineoplastic radiation therapy: Secondary | ICD-10-CM | POA: Insufficient documentation

## 2016-09-12 DIAGNOSIS — M6140 Other calcification of muscle, unspecified site: Secondary | ICD-10-CM

## 2016-09-12 DIAGNOSIS — I252 Old myocardial infarction: Secondary | ICD-10-CM | POA: Insufficient documentation

## 2016-09-12 DIAGNOSIS — Z8249 Family history of ischemic heart disease and other diseases of the circulatory system: Secondary | ICD-10-CM | POA: Insufficient documentation

## 2016-09-12 DIAGNOSIS — M9689 Other intraoperative and postprocedural complications and disorders of the musculoskeletal system: Principal | ICD-10-CM

## 2016-09-12 DIAGNOSIS — X58XXXS Exposure to other specified factors, sequela: Secondary | ICD-10-CM | POA: Insufficient documentation

## 2016-09-12 DIAGNOSIS — S82892A Other fracture of left lower leg, initial encounter for closed fracture: Secondary | ICD-10-CM | POA: Diagnosis present

## 2016-09-12 DIAGNOSIS — F17210 Nicotine dependence, cigarettes, uncomplicated: Secondary | ICD-10-CM | POA: Insufficient documentation

## 2016-09-12 DIAGNOSIS — S9305XA Dislocation of left ankle joint, initial encounter: Secondary | ICD-10-CM

## 2016-09-12 DIAGNOSIS — S32422S Displaced fracture of posterior wall of left acetabulum, sequela: Secondary | ICD-10-CM

## 2016-09-12 DIAGNOSIS — Z79899 Other long term (current) drug therapy: Secondary | ICD-10-CM | POA: Insufficient documentation

## 2016-09-12 DIAGNOSIS — S73015A Posterior dislocation of left hip, initial encounter: Secondary | ICD-10-CM | POA: Diagnosis present

## 2016-09-12 DIAGNOSIS — S32409S Unspecified fracture of unspecified acetabulum, sequela: Secondary | ICD-10-CM | POA: Insufficient documentation

## 2016-09-12 DIAGNOSIS — S069X0S Unspecified intracranial injury without loss of consciousness, sequela: Secondary | ICD-10-CM

## 2016-09-12 DIAGNOSIS — Z9889 Other specified postprocedural states: Secondary | ICD-10-CM | POA: Insufficient documentation

## 2016-09-12 HISTORY — DX: Dislocation of left ankle joint, initial encounter: S93.05XA

## 2016-09-12 HISTORY — DX: Posterior dislocation of left hip, initial encounter: S73.015A

## 2016-09-12 LAB — CBC
HCT: 29.8 % — ABNORMAL LOW (ref 39.0–52.0)
Hemoglobin: 10 g/dL — ABNORMAL LOW (ref 13.0–17.0)
MCH: 30.5 pg (ref 26.0–34.0)
MCHC: 33.6 g/dL (ref 30.0–36.0)
MCV: 90.9 fL (ref 78.0–100.0)
PLATELETS: 202 10*3/uL (ref 150–400)
RBC: 3.28 MIL/uL — ABNORMAL LOW (ref 4.22–5.81)
RDW: 13.5 % (ref 11.5–15.5)
WBC: 9.4 10*3/uL (ref 4.0–10.5)

## 2016-09-12 LAB — BASIC METABOLIC PANEL
ANION GAP: 5 (ref 5–15)
BUN: 12 mg/dL (ref 6–20)
CALCIUM: 8.3 mg/dL — AB (ref 8.9–10.3)
CO2: 30 mmol/L (ref 22–32)
CREATININE: 0.84 mg/dL (ref 0.61–1.24)
Chloride: 99 mmol/L — ABNORMAL LOW (ref 101–111)
GLUCOSE: 133 mg/dL — AB (ref 65–99)
Potassium: 4.7 mmol/L (ref 3.5–5.1)
Sodium: 134 mmol/L — ABNORMAL LOW (ref 135–145)

## 2016-09-12 NOTE — Progress Notes (Signed)
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 2-3 cm superior displacement of the left femoral head into the acetabular defect. The proximal left femur appears grossly intact. The right hip joint is unremarkable. Mild degenerative change is noted at the lower lumbar spine. The sacroiliac joints are unremarkable. The visualized bowel gas pattern is within normal limits. IMPRESSION: Comminuted fracture of the superior left acetabulum, with displaced fragments. 2-3 cm of associated superior displacement of the left femoral head into the acetabular defect. These results were called by telephone at the time of interpretation on 09/09/2016 at 5:55 pm to Dr. Corlis Leak, who verbally acknowledged these results. Electronically Signed   By: Roanna Raider M.D.   On: 09/09/2016 18:03   Dg Hip Port Unilat With Pelvis 1v Left  Result Date: 09/10/2016 CLINICAL DATA:  66 year old male with fracture of the left acetabulum. Follow-up. EXAM: DG HIP (WITH OR WITHOUT PELVIS) 1V PORT LEFT COMPARISON:  Radiograph dated 09/09/2016 FINDINGS: There is comminuted and displaced fracture of the superior left acetabulum similar to prior radiograph. There is superior subluxation of the left femur with impaction of the femoral  head to the superior acetabular defect. Overall there is no significant interval change in the appearance of the acetabulum fracture and impacted and subluxed left femur. The visualized portion of the proximal femur appears intact. IMPRESSION: Comminuted fracture of the superior left acetabulum with superior subluxation and impaction of the femoral head to the acetabular defect. Findings are similar to prior radiograph. Electronically Signed   By: Elgie Collard M.D.   On: 09/10/2016 05:01       IMPRESSION:  The patient has been diagnosed with a acetabular fracture of the left hip. 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  I have discussed the rationale of this treatment with the patient. I have discussed the possible/expected benefit of such a treatment. I have also discussed the possible side effects and risks of treatment as well. All of the patient's questions have been answered.   PLAN: The patient will undergo simulation and one fraction of external beam radiation treatment. This will be completed to a dose of 7 Gy. This treatment will be completed on postoperative day #3.       ________________________________   [name], MD, PhD   **Disclaimer: This note was dictated with voice recognition software. Similar sounding  words can inadvertently be transcribed and this note may contain transcription errors which may not have been corrected upon publication of note.**

## 2016-09-12 NOTE — Progress Notes (Signed)
[facility] to go to [facility] fro Heterotopic Ossification Medicated with dilaudid for transfer.  No s/s of any acute distress at time of transfer.
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
## 2016-09-12 NOTE — Addendum Note (Signed)
Encounter addended by: Dorothy PufferJohn Aili Casillas, MD on: 09/12/2016  5:36 PM<BR>    Actions taken: Sign clinical note
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
## 2016-09-12 NOTE — Telephone Encounter (Signed)
Called [facility] dispaatcher Phil calle, patient will be ready to go back at 2pm, asked patient how his pain was, patient stated"I;'m alriight, not hurting much' 1:27 PM

## 2016-09-12 NOTE — Progress Notes (Signed)
Rehab Admissions Coordinator Note:  Patient was screened by [name], [name] for appropriateness for an Inpatient Acute Rehab Consult. Per OT eval.  At this time, we are recommending Inpatient Rehab consult.  [name], [name] 09/12/2016, 10:59 AM  I can be reached at [phone].

## 2016-09-12 NOTE — Progress Notes (Signed)
  Radiation Oncology         [phone] ________________________________  Name: Donaciano EvaCurtis E Bagnell MRN: [id number]  Date: 09/12/2016  DOB: 04/28/1951  SIMULATION AND TREATMENT PLANNING NOTE  DIAGNOSIS:     ICD-9-CM ICD-10-CM   1. 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  Identity was confirmed.  All relevant records and images related to the planned course of therapy were reviewed.   Written consent to proceed with treatment was confirmed which was freely given after reviewing the details related to the planned course of therapy had been reviewed with the patient.  Then, the patient was set-up in a stable reproducible supine position for radiation therapy.  Xrays were obtained of the target hip area.     The images were reviewed and 2 treatment fields were designed and positioned to treat the appropriate target region. A simple isodose plan is requested.  PLAN:  The patient will receive 7 Gy in 1 fraction.    Simulation verification Port films were taken prior to treatment. Each of the treatment fields was reviewed and appropriately delineates the target regions and the patient was appropriate to proceed with radiation treatment.  ________________________________   Radene GunningJohn S. Adisa Litt, MD, PhD

## 2016-09-12 NOTE — Progress Notes (Signed)
I will follow up on Monday with pt and family to assess caregiver support he will need at a wheelchair level for d/c. This will assist in planning dispo venue options. [phone]
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
## 2016-09-12 NOTE — Progress Notes (Signed)
   Post Radiation Note:  Report called to Inpatient RN: Katrinka BlazingMelodie,RN Time: 09/12/2016,9:25 AM  Was radiation completed: No.  Were any PRN medications given: No. Name and time:   Were there any additional events: none, patient tolerated well, xcalled the floor spoke with the secretary  Lowella PettiesMcElroy, Antania Hoefling Bruner, RN 09/12/2016,9:25 AM

## 2016-09-12 NOTE — Telephone Encounter (Signed)
Called [facility]  Spoke with Michele Mcalpinehil, they have him on their transportation list to be here cancer center,radiation dept  at 1230 , thanked Phil,dispatcher 9:17 AM
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
## 2016-09-13 LAB — CBC
HCT: 26.2 % — ABNORMAL LOW (ref 39.0–52.0)
HEMOGLOBIN: 8.9 g/dL — AB (ref 13.0–17.0)
MCH: 30.7 pg (ref 26.0–34.0)
MCHC: 34 g/dL (ref 30.0–36.0)
MCV: 90.3 fL (ref 78.0–100.0)
Platelets: 190 10*3/uL (ref 150–400)
RBC: 2.9 MIL/uL — ABNORMAL LOW (ref 4.22–5.81)
RDW: 13.2 % (ref 11.5–15.5)
WBC: 8 10*3/uL (ref 4.0–10.5)

## 2016-09-13 LAB — BASIC METABOLIC PANEL
Anion gap: 8 (ref 5–15)
BUN: 12 mg/dL (ref 6–20)
CALCIUM: 8.3 mg/dL — AB (ref 8.9–10.3)
CHLORIDE: 98 mmol/L — AB (ref 101–111)
CO2: 30 mmol/L (ref 22–32)
CREATININE: 0.85 mg/dL (ref 0.61–1.24)
GFR calc Af Amer: >60 mL/min (ref >60)
GFR calc non Af Amer: >60 mL/min (ref >60)
Glucose, Bld: 122 mg/dL — ABNORMAL HIGH (ref 65–99)
Potassium: 4 mmol/L (ref 3.5–5.1)
SODIUM: 136 mmol/L (ref 135–145)

## 2016-09-13 MED ORDER — HYDROMORPHONE HCL 2 MG/ML IJ SOLN
1.0000 mg | INTRAMUSCULAR | Status: DC | PRN
  Administered 2016-09-13: 2 mg via INTRAVENOUS
  Filled 2016-09-13: qty 1

## 2016-09-13 MED ORDER — HYDROMORPHONE HCL 2 MG/ML IJ SOLN
1.0000 mg | INTRAMUSCULAR | Status: DC | PRN
  Administered 2016-09-13 – 2016-09-15 (×5): 2 mg via INTRAVENOUS
  Filled 2016-09-13 (×6): qty 1
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
## 2016-09-14 LAB — CBC
HCT: 26.3 % — ABNORMAL LOW (ref 39.0–52.0)
Hemoglobin: 9.1 g/dL — ABNORMAL LOW (ref 13.0–17.0)
MCH: 31 pg (ref 26.0–34.0)
MCHC: 34.6 g/dL (ref 30.0–36.0)
MCV: 89.5 fL (ref 78.0–100.0)
PLATELETS: 217 10*3/uL (ref 150–400)
RBC: 2.94 MIL/uL — ABNORMAL LOW (ref 4.22–5.81)
RDW: 12.7 % (ref 11.5–15.5)
WBC: 6.8 10*3/uL (ref 4.0–10.5)

## 2016-09-14 MED ORDER — DIPHENHYDRAMINE HCL 50 MG/ML IJ SOLN
25.0000 mg | Freq: Four times a day (QID) | INTRAMUSCULAR | Status: DC | PRN
  Administered 2016-09-14: 25 mg via INTRAVENOUS
  Filled 2016-09-14: qty 1
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
## 2016-09-14 NOTE — Progress Notes (Signed)
**Note Bryan-Identified via Obfuscation** Trauma Service Note  Subjective: Pain when joint gets stiff. Up to chair today, some left chest soreness  Objective: Vital signs in last 24 hours: Temp:  [98.7 F (37.1 C)-99.6 F (37.6 C)] 98.7 F (37.1 C) (02/25 0408) Pulse Rate:  [82-95] 95 (02/25 0408) Resp:  [16-18] 16 (02/25 0408) BP: (122-134)/(65-72) 134/72 (02/25 0408) SpO2:  [94 %-96 %] 95 % (02/25 0408) Last BM Date: 09/12/16  Intake/Output from previous day: 02/24 0701 - 02/25 0700 In: 476 [P.O.:476] Out: 2350 [Urine:2350] Intake/Output this shift: No intake/output data recorded.  General: NAd  Lungs: CTAB  Abd: soft, NT, ND,  Extremities: left leg wrapped, no edema  Neuro: AOx4  Lab Results: CBC   Recent Labs  09/13/16 0404 09/14/16 0358  WBC 8.0 6.8  HGB 8.9* 9.1*  HCT 26.2* 26.3*  PLT 190 217   BMET  Recent Labs  09/12/16 0340 09/13/16 0404  NA 134* 136  K 4.7 4.0  CL 99* 98*  CO2 30 30  GLUCOSE 133* 122*  BUN 12 12  CREATININE 0.84 0.85  CALCIUM 8.3* 8.3*   PT/INR No results for input(s): LABPROT, INR in the last 72 hours. ABG No results for input(s): PHART, HCO3 in the last 72 hours.  Invalid input(s): PCO2, PO2  Studies/Results: No results found.  Anti-infectives: Anti-infectives    Start     Dose/Rate Route Frequency Ordered Stop   09/11/16 2030  ceFAZolin (ANCEF) IVPB 1 g/50 mL premix     1 g 100 mL/hr over 30 Minutes Intravenous Every 6 hours 09/11/16 1620 09/12/16 0851   09/11/16 1130  ceFAZolin (ANCEF) IVPB 2g/100 mL premix     2 g 200 mL/hr over 30 Minutes Intravenous To Short Stay 09/10/16 1120 09/12/16 1130      Medications Scheduled Meds: . docusate sodium  100 mg Oral BID  . levETIRAcetam  500 mg Oral BID   Continuous Infusions: . lactated ringers Stopped (09/12/16 1200)   PRN Meds:.acetaminophen, acetaminophen **OR** [DISCONTINUED] acetaminophen, hydrALAZINE, HYDROmorphone (DILAUDID) injection, labetalol, methocarbamol **OR** [DISCONTINUED]  methocarbamol (ROBAXIN)  IV, metoCLOPramide **OR** [DISCONTINUED] metoCLOPramide (REGLAN) injection, morphine injection, ondansetron **OR** ondansetron (ZOFRAN) IV, oxyCODONE, polyethylene glycol  Assessment/Plan: s/p Procedure(s): OPEN REDUCTION INTERNAL FIXATION (ORIF) ACETABULAR FRACTURE IRRIGATION AND DEBRIDEMENT LEFT KNEE OPEN REDUCTION INTERNAL FIXATION (ORIF) LEFT ANKLE FRACTURE -continue PT -continue diet -continue pain control -transition to IP rehab soon  LOS: 5 days   Bryan Levy Trauma Surgeon [phone]--office [facility] 09/14/2016
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
## 2016-09-15 ENCOUNTER — Encounter (HOSPITAL_COMMUNITY): Payer: Self-pay | Admitting: Orthopedic Surgery

## 2016-09-15 MED ORDER — DIPHENHYDRAMINE HCL 25 MG PO CAPS: 25.0000 mg | ORAL_CAPSULE | Freq: Four times a day (QID) | ORAL | Status: DC | PRN

## 2016-09-15 MED ORDER — ENOXAPARIN SODIUM 40 MG/0.4ML ~~LOC~~ SOLN: 40.0000 mg | SUBCUTANEOUS | Status: DC
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
## 2016-09-15 NOTE — Addendum Note (Signed)
Encounter addended by: Lacy DuverneyJanice B Azael Ragain, RN on: 09/15/2016  8:29 AM<BR>    Actions taken: Charge Capture section accepted
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
## 2016-09-16 ENCOUNTER — Encounter: Payer: Self-pay | Admitting: Cardiology

## 2016-09-16 MED ORDER — ENOXAPARIN SODIUM 40 MG/0.4ML ~~LOC~~ SOLN: 40.0000 mg | SUBCUTANEOUS | 0 refills | Status: DC

## 2016-09-16 MED ORDER — OXYCODONE HCL 5 MG PO TABS: 5.0000 mg | ORAL_TABLET | Freq: Four times a day (QID) | ORAL | 0 refills | Status: DC | PRN

## 2016-09-16 MED ORDER — ACETAMINOPHEN 325 MG PO TABS: 650.0000 mg | ORAL_TABLET | Freq: Four times a day (QID) | ORAL | Status: DC | PRN

## 2016-09-16 MED ORDER — LEVETIRACETAM 500 MG PO TABS: 500.0000 mg | ORAL_TABLET | Freq: Two times a day (BID) | ORAL | 0 refills | Status: DC

## 2016-09-16 MED ORDER — MAGNESIUM CITRATE PO SOLN
0.5000 | Freq: Once | ORAL | Status: AC
  Administered 2016-09-16: 0.5 via ORAL
  Filled 2016-09-16: qty 296

## 2016-09-16 MED ORDER — POLYETHYLENE GLYCOL 3350 17 G PO PACK: 17.0000 g | PACK | Freq: Every day | ORAL | 0 refills | Status: DC | PRN

## 2016-09-16 NOTE — Care Management Note (Signed)
Case Management Note  Patient Details  Name: Bryan Levy MRN: [id number] Date of Birth: Aug 08, 1950  Subjective/Objective:    Pt medically stable for discharge home today.  He now wishes to go home and declines CIR stay.  He states that his wife and son will be able to assist with care at discharge.                  Action/Plan: Met with pt to discuss discharge plans.  Referral to [facility] for DME needs.  Pt has [employer], which requires authorization through [employer] for all [place].  Will submit referral to [employer] today for [facility] services and notify pt when [facility] agency is found/approved.  Explained process to pt; he is aware this process may take 2-3 days.  Will call pt at home with [facility] information.    Expected Discharge Date:  09/16/16               Expected Discharge Plan:  [place]  In-House Referral:     Discharge planning Services  CM Consult  Post Acute Care Choice:  Durable Medical Equipment Choice offered to:     DME Arranged:  3-N-1, Tub bench, Walker rolling DME Agency:  [place]:  PT, OT [facility] Agency:  Other - See comment  Status of Service:  Completed, signed off  If discussed at [place] of Stay Meetings, dates discussed:    Additional Comments:  Reinaldo Raddle, RN, BSN  Trauma/Neuro ICU Case Manager [phone]
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
## 2016-09-16 NOTE — Anesthesia Postprocedure Evaluation (Addendum)
Anesthesia Post Note  Patient: Bryan Levy  Procedure(s) Performed: Procedure(s) (LRB): OPEN REDUCTION INTERNAL FIXATION (ORIF) ACETABULAR FRACTURE (Left) IRRIGATION AND DEBRIDEMENT LEFT KNEE (Left) OPEN REDUCTION INTERNAL FIXATION (ORIF) LEFT ANKLE FRACTURE (Left)  Patient location during evaluation: PACU Anesthesia Type: General Level of consciousness: awake Pain management: pain level controlled Vital Signs Assessment: post-procedure vital signs reviewed and stable Respiratory status: spontaneous breathing Cardiovascular status: stable Postop Assessment: no signs of nausea or vomiting Anesthetic complications: no        Last Vitals:  Vitals:   09/16/16 0448 09/16/16 1300  BP: 129/68 125/68  Pulse: 83 89  Resp: 20 18  Temp: 37.1 C 36.9 C    Last Pain:  Vitals:   09/16/16 1300  TempSrc: Oral  PainSc:    Pain Goal: Patients Stated Pain Goal: 4 (09/12/16 1557)               Graviela Nodal JR,JOHN Susann GivensFRANKLIN
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
## 2016-09-19 ENCOUNTER — Telehealth (HOSPITAL_COMMUNITY): Payer: Self-pay

## 2016-09-19 NOTE — Telephone Encounter (Signed)
Returned phone call to number provided. Spoke with someone from [employer]. She stated that Sheralyn Boatmanoni did not leave a note in the chart therefore unsure what he was calling in regards to. If Sheralyn Boatmanoni calls back please ask him to leave a direct phone number or extension to reach him.
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
## 2016-09-26 ENCOUNTER — Telehealth (HOSPITAL_COMMUNITY): Payer: Self-pay

## 2016-09-29 ENCOUNTER — Telehealth (HOSPITAL_COMMUNITY): Payer: Self-pay

## 2016-09-29 ENCOUNTER — Encounter: Payer: Self-pay | Admitting: Radiation Oncology

## 2016-09-29 NOTE — Progress Notes (Signed)
  Radiation Oncology         ([phone]) [phone] ________________________________  Name: Bryan Levy MRN: [id number]  Date: 09/29/2016  DOB: 04/22/1951  End of Treatment Note  Diagnosis:   Acetabular fracture of the left hip     Indication for treatment:  Curative       Radiation treatment dates:   09/12/16  Site/dose:   Left hip/ 7 Gy in 1 fraction  Beams/energy:   Parallel opposed/ 6X  Narrative: The patient tolerated radiation treatment relatively well.   Plan: The patient has completed radiation treatment. The patient will return to radiation oncology clinic for routine followup in one month. I advised them to call or return sooner if they have any questions or concerns related to their recovery or treatment.  ------------------------------------------------  Radene GunningJohn S. Lake Cinquemani, MD, PhD  This document serves as a record of services personally performed by Dorothy PufferJohn Haeden Hudock, MD. It was created on his behalf by Tawni LevyLeslie Hoyt, a trained medical scribe. The creation of this record is based on the scribe's personal observations and the provider's statements to them. This document has been checked and approved by the attending provider.

## 2016-09-29 NOTE — Telephone Encounter (Signed)
Spoke with our trauma Case Mananger, Raynelle FanningJulie, regarding this patients care. Unfortunately, It has been multiple weeks that this patient has been without home health. Raynelle FanningJulie will talk directly with [facility] and we will do what we can to help make arrangements. Outpatient PT may be a possibility if [facility].

## 2016-10-01 NOTE — Telephone Encounter (Signed)
Spoke with patient and relayed the above information. States that he spoke with someone about this yesterday, and they advised they would call him back when they found out more information about HH PT vs outpatient PT. No further needs at this time.

## 2016-10-07 ENCOUNTER — Ambulatory Visit: Payer: Private Health Insurance - Indemnity | Admitting: Physical Therapy

## 2016-10-08 ENCOUNTER — Ambulatory Visit (HOSPITAL_COMMUNITY): Payer: Private Health Insurance - Indemnity | Admitting: Physical Therapy

## 2016-10-09 ENCOUNTER — Ambulatory Visit (HOSPITAL_COMMUNITY): Payer: Self-pay | Attending: Orthopedic Surgery

## 2016-10-09 DIAGNOSIS — M25652 Stiffness of left hip, not elsewhere classified: Secondary | ICD-10-CM | POA: Insufficient documentation

## 2016-10-09 DIAGNOSIS — M25672 Stiffness of left ankle, not elsewhere classified: Secondary | ICD-10-CM | POA: Insufficient documentation

## 2016-10-09 DIAGNOSIS — M25662 Stiffness of left knee, not elsewhere classified: Secondary | ICD-10-CM | POA: Insufficient documentation

## 2016-10-09 DIAGNOSIS — R2689 Other abnormalities of gait and mobility: Secondary | ICD-10-CM | POA: Insufficient documentation
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
## 2016-10-10 ENCOUNTER — Ambulatory Visit (HOSPITAL_COMMUNITY): Payer: Self-pay | Admitting: Physical Therapy

## 2016-10-10 DIAGNOSIS — R2689 Other abnormalities of gait and mobility: Secondary | ICD-10-CM

## 2016-10-10 DIAGNOSIS — M25672 Stiffness of left ankle, not elsewhere classified: Secondary | ICD-10-CM

## 2016-10-10 DIAGNOSIS — M25662 Stiffness of left knee, not elsewhere classified: Secondary | ICD-10-CM

## 2016-10-10 DIAGNOSIS — M25652 Stiffness of left hip, not elsewhere classified: Secondary | ICD-10-CM
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
## 2016-10-15 ENCOUNTER — Ambulatory Visit (HOSPITAL_COMMUNITY): Payer: Self-pay

## 2016-10-15 ENCOUNTER — Encounter (HOSPITAL_COMMUNITY): Payer: Self-pay

## 2016-10-15 DIAGNOSIS — M25672 Stiffness of left ankle, not elsewhere classified: Secondary | ICD-10-CM

## 2016-10-15 DIAGNOSIS — M25652 Stiffness of left hip, not elsewhere classified: Secondary | ICD-10-CM

## 2016-10-15 DIAGNOSIS — R2689 Other abnormalities of gait and mobility: Secondary | ICD-10-CM

## 2016-10-15 DIAGNOSIS — M25662 Stiffness of left knee, not elsewhere classified: Secondary | ICD-10-CM
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
## 2016-10-16 ENCOUNTER — Ambulatory Visit (HOSPITAL_COMMUNITY): Payer: Self-pay

## 2016-10-16 DIAGNOSIS — M25662 Stiffness of left knee, not elsewhere classified: Secondary | ICD-10-CM

## 2016-10-16 DIAGNOSIS — R2689 Other abnormalities of gait and mobility: Secondary | ICD-10-CM

## 2016-10-16 DIAGNOSIS — M25652 Stiffness of left hip, not elsewhere classified: Secondary | ICD-10-CM

## 2016-10-16 DIAGNOSIS — M25672 Stiffness of left ankle, not elsewhere classified: Secondary | ICD-10-CM
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
## 2016-10-20 ENCOUNTER — Ambulatory Visit (HOSPITAL_COMMUNITY): Payer: Self-pay | Attending: Orthopedic Surgery | Admitting: Physical Therapy

## 2016-10-20 DIAGNOSIS — M25662 Stiffness of left knee, not elsewhere classified: Secondary | ICD-10-CM | POA: Insufficient documentation

## 2016-10-20 DIAGNOSIS — M25672 Stiffness of left ankle, not elsewhere classified: Secondary | ICD-10-CM | POA: Insufficient documentation

## 2016-10-20 DIAGNOSIS — R2689 Other abnormalities of gait and mobility: Secondary | ICD-10-CM | POA: Insufficient documentation

## 2016-10-20 DIAGNOSIS — M25652 Stiffness of left hip, not elsewhere classified: Secondary | ICD-10-CM | POA: Insufficient documentation
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
## 2016-10-21 ENCOUNTER — Encounter (HOSPITAL_COMMUNITY): Payer: Self-pay

## 2016-10-23 ENCOUNTER — Ambulatory Visit (HOSPITAL_COMMUNITY): Payer: Self-pay

## 2016-10-23 DIAGNOSIS — R2689 Other abnormalities of gait and mobility: Secondary | ICD-10-CM

## 2016-10-23 DIAGNOSIS — M25652 Stiffness of left hip, not elsewhere classified: Secondary | ICD-10-CM

## 2016-10-23 DIAGNOSIS — M25662 Stiffness of left knee, not elsewhere classified: Secondary | ICD-10-CM

## 2016-10-23 DIAGNOSIS — M25672 Stiffness of left ankle, not elsewhere classified: Secondary | ICD-10-CM
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
## 2016-10-23 NOTE — Patient Instructions (Addendum)
  Dorsiflexion: Isometric    With ball or rolled pillow between feet, squeeze feet together. Hold 5 seconds. Relax. Repeat 5 times per set. Do 2 sets per day http://orth.exer.us/2   Copyright  VHI. All rights reserved.   Plantar Flexion: Isometric    Press left foot into ball or rolled pillow against wall. Hold 5_ seconds. Relax. Repeat 5 times per set. Do 2 sets per day  http://orth.exer.us/0   Copyright  VHI. All rights reserved.   Inversion: Isometric    Press inner borders of feet into ball or rolled pillow between feet. Hold 5 seconds. Relax. Repeat 5 times per set. Do 2 sets per day. http://orth.exer.us/6   Copyright  VHI. All rights reserved.   Eversion: Isometric    Press outer border of right foot into ball or rolled pillow against wall. Hold 5 seconds. Relax. Repeat 5 times per set. Do 2 sets per day.  http://orth.exer.us/4   Copyright  VHI. All rights reserved.   Ankle Alphabet    Using left ankle and foot only, trace the letters of the alphabet. Perform A to Z. Repeat 3 times per set. Do 2 sets per day. http://orth.exer.us/16   Copyright  VHI. All rights reserved.   Abduction: Side Leg Lift (Eccentric) - Side-Lying    Lie on side. Lift top leg slightly higher than shoulder level. Keep top leg straight with body, toes pointing forward. Slowly lower for 3-5 seconds. 10-15 reps per set, 2 sets per day.  http://ecce.exer.us/62   Copyright  VHI. All rights reserved.

## 2016-10-28 ENCOUNTER — Encounter (HOSPITAL_COMMUNITY): Payer: Self-pay

## 2016-10-28 ENCOUNTER — Ambulatory Visit (HOSPITAL_COMMUNITY): Payer: Self-pay

## 2016-10-28 DIAGNOSIS — M25672 Stiffness of left ankle, not elsewhere classified: Secondary | ICD-10-CM

## 2016-10-28 DIAGNOSIS — R2689 Other abnormalities of gait and mobility: Secondary | ICD-10-CM

## 2016-10-28 DIAGNOSIS — M25662 Stiffness of left knee, not elsewhere classified: Secondary | ICD-10-CM

## 2016-10-28 DIAGNOSIS — M25652 Stiffness of left hip, not elsewhere classified: Secondary | ICD-10-CM
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
## 2016-10-30 ENCOUNTER — Ambulatory Visit (HOSPITAL_COMMUNITY): Payer: Self-pay

## 2016-10-30 DIAGNOSIS — M25652 Stiffness of left hip, not elsewhere classified: Secondary | ICD-10-CM

## 2016-10-30 DIAGNOSIS — M25672 Stiffness of left ankle, not elsewhere classified: Secondary | ICD-10-CM

## 2016-10-30 DIAGNOSIS — M25662 Stiffness of left knee, not elsewhere classified: Secondary | ICD-10-CM

## 2016-10-30 DIAGNOSIS — R2689 Other abnormalities of gait and mobility: Secondary | ICD-10-CM
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
## 2016-11-04 ENCOUNTER — Ambulatory Visit (HOSPITAL_COMMUNITY): Payer: Self-pay

## 2016-11-04 DIAGNOSIS — M25662 Stiffness of left knee, not elsewhere classified: Secondary | ICD-10-CM

## 2016-11-04 DIAGNOSIS — M25652 Stiffness of left hip, not elsewhere classified: Secondary | ICD-10-CM

## 2016-11-04 DIAGNOSIS — R2689 Other abnormalities of gait and mobility: Secondary | ICD-10-CM

## 2016-11-04 DIAGNOSIS — M25672 Stiffness of left ankle, not elsewhere classified: Secondary | ICD-10-CM
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
## 2016-11-06 ENCOUNTER — Ambulatory Visit (HOSPITAL_COMMUNITY): Payer: Self-pay

## 2016-11-06 ENCOUNTER — Encounter (HOSPITAL_COMMUNITY): Payer: Self-pay

## 2016-11-06 DIAGNOSIS — M25662 Stiffness of left knee, not elsewhere classified: Secondary | ICD-10-CM

## 2016-11-06 DIAGNOSIS — R2689 Other abnormalities of gait and mobility: Secondary | ICD-10-CM

## 2016-11-06 DIAGNOSIS — M25672 Stiffness of left ankle, not elsewhere classified: Secondary | ICD-10-CM

## 2016-11-06 DIAGNOSIS — M25652 Stiffness of left hip, not elsewhere classified: Secondary | ICD-10-CM
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
## 2016-11-11 ENCOUNTER — Ambulatory Visit (HOSPITAL_COMMUNITY): Payer: Self-pay

## 2016-11-11 DIAGNOSIS — M25662 Stiffness of left knee, not elsewhere classified: Secondary | ICD-10-CM

## 2016-11-11 DIAGNOSIS — M25672 Stiffness of left ankle, not elsewhere classified: Secondary | ICD-10-CM

## 2016-11-11 DIAGNOSIS — R2689 Other abnormalities of gait and mobility: Secondary | ICD-10-CM

## 2016-11-11 DIAGNOSIS — M25652 Stiffness of left hip, not elsewhere classified: Secondary | ICD-10-CM
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
## 2016-11-13 ENCOUNTER — Ambulatory Visit (HOSPITAL_COMMUNITY): Payer: Self-pay

## 2016-11-13 ENCOUNTER — Encounter (HOSPITAL_COMMUNITY): Payer: Self-pay

## 2016-11-13 DIAGNOSIS — M25672 Stiffness of left ankle, not elsewhere classified: Secondary | ICD-10-CM

## 2016-11-13 DIAGNOSIS — M25652 Stiffness of left hip, not elsewhere classified: Secondary | ICD-10-CM

## 2016-11-13 DIAGNOSIS — R2689 Other abnormalities of gait and mobility: Secondary | ICD-10-CM

## 2016-11-13 DIAGNOSIS — M25662 Stiffness of left knee, not elsewhere classified: Secondary | ICD-10-CM

## 2016-11-13 NOTE — Patient Instructions (Signed)
  Supine HSS with Strap  Start: Position yourself on your back with a strap or towel placed around your foot.  Movement: Pull on the towel to raise your leg and feel a stretch on the back of the leg  Can bend Right leg up to take pressure off back.    Homemade ice pack: Fill the plastic freezer bag with 1 cup of rubbing alcohol and 2 cups of water. Try to get as much air out of the freezer bag before sealing it shut. Place the bag and its contents inside a second freezer bag to contain any leakage. Leave the bag in the freezer for at least an hour. When you put it on your ankle, put a towel in between bag and your skin.
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
## 2016-11-18 ENCOUNTER — Ambulatory Visit (HOSPITAL_COMMUNITY): Payer: Self-pay | Attending: Orthopedic Surgery

## 2016-11-18 DIAGNOSIS — R2689 Other abnormalities of gait and mobility: Secondary | ICD-10-CM | POA: Insufficient documentation

## 2016-11-18 DIAGNOSIS — M25652 Stiffness of left hip, not elsewhere classified: Secondary | ICD-10-CM | POA: Insufficient documentation

## 2016-11-18 DIAGNOSIS — M25672 Stiffness of left ankle, not elsewhere classified: Secondary | ICD-10-CM | POA: Insufficient documentation

## 2016-11-18 DIAGNOSIS — M25662 Stiffness of left knee, not elsewhere classified: Secondary | ICD-10-CM | POA: Insufficient documentation
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
## 2016-11-20 ENCOUNTER — Ambulatory Visit (HOSPITAL_COMMUNITY): Payer: Self-pay

## 2016-11-20 ENCOUNTER — Encounter (HOSPITAL_COMMUNITY): Payer: Self-pay

## 2016-11-20 DIAGNOSIS — R2689 Other abnormalities of gait and mobility: Secondary | ICD-10-CM

## 2016-11-20 DIAGNOSIS — M25662 Stiffness of left knee, not elsewhere classified: Secondary | ICD-10-CM

## 2016-11-20 DIAGNOSIS — M25652 Stiffness of left hip, not elsewhere classified: Secondary | ICD-10-CM

## 2016-11-20 DIAGNOSIS — M25672 Stiffness of left ankle, not elsewhere classified: Secondary | ICD-10-CM
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
## 2016-11-25 ENCOUNTER — Ambulatory Visit (HOSPITAL_COMMUNITY): Payer: Self-pay

## 2016-11-25 DIAGNOSIS — M25652 Stiffness of left hip, not elsewhere classified: Secondary | ICD-10-CM

## 2016-11-25 DIAGNOSIS — R2689 Other abnormalities of gait and mobility: Secondary | ICD-10-CM

## 2016-11-25 DIAGNOSIS — M25672 Stiffness of left ankle, not elsewhere classified: Secondary | ICD-10-CM

## 2016-11-25 DIAGNOSIS — M25662 Stiffness of left knee, not elsewhere classified: Secondary | ICD-10-CM
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
## 2016-11-27 ENCOUNTER — Encounter (HOSPITAL_COMMUNITY): Payer: Self-pay

## 2016-12-02 ENCOUNTER — Ambulatory Visit (HOSPITAL_COMMUNITY): Payer: Self-pay

## 2016-12-02 DIAGNOSIS — R2689 Other abnormalities of gait and mobility: Secondary | ICD-10-CM

## 2016-12-02 DIAGNOSIS — M25652 Stiffness of left hip, not elsewhere classified: Secondary | ICD-10-CM

## 2016-12-02 DIAGNOSIS — M25662 Stiffness of left knee, not elsewhere classified: Secondary | ICD-10-CM

## 2016-12-02 DIAGNOSIS — M25672 Stiffness of left ankle, not elsewhere classified: Secondary | ICD-10-CM
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
## 2016-12-04 ENCOUNTER — Encounter (HOSPITAL_COMMUNITY): Payer: Self-pay

## 2016-12-05 ENCOUNTER — Ambulatory Visit (HOSPITAL_COMMUNITY): Payer: Self-pay

## 2016-12-05 ENCOUNTER — Encounter (HOSPITAL_COMMUNITY): Payer: Self-pay

## 2016-12-05 DIAGNOSIS — R2689 Other abnormalities of gait and mobility: Secondary | ICD-10-CM

## 2016-12-05 DIAGNOSIS — M25672 Stiffness of left ankle, not elsewhere classified: Secondary | ICD-10-CM

## 2016-12-05 DIAGNOSIS — M25662 Stiffness of left knee, not elsewhere classified: Secondary | ICD-10-CM

## 2016-12-05 DIAGNOSIS — M25652 Stiffness of left hip, not elsewhere classified: Secondary | ICD-10-CM
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
## 2016-12-10 ENCOUNTER — Ambulatory Visit (HOSPITAL_COMMUNITY): Payer: Self-pay

## 2016-12-10 DIAGNOSIS — R2689 Other abnormalities of gait and mobility: Secondary | ICD-10-CM

## 2016-12-10 DIAGNOSIS — M25672 Stiffness of left ankle, not elsewhere classified: Secondary | ICD-10-CM

## 2016-12-10 DIAGNOSIS — M25662 Stiffness of left knee, not elsewhere classified: Secondary | ICD-10-CM

## 2016-12-10 DIAGNOSIS — M25652 Stiffness of left hip, not elsewhere classified: Secondary | ICD-10-CM
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
## 2016-12-16 ENCOUNTER — Ambulatory Visit (HOSPITAL_COMMUNITY): Payer: Self-pay

## 2016-12-16 DIAGNOSIS — M25652 Stiffness of left hip, not elsewhere classified: Secondary | ICD-10-CM

## 2016-12-16 DIAGNOSIS — M25672 Stiffness of left ankle, not elsewhere classified: Secondary | ICD-10-CM

## 2016-12-16 DIAGNOSIS — R2689 Other abnormalities of gait and mobility: Secondary | ICD-10-CM

## 2016-12-16 DIAGNOSIS — M25662 Stiffness of left knee, not elsewhere classified: Secondary | ICD-10-CM
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
## 2016-12-16 NOTE — Patient Instructions (Signed)
Functional Quadriceps: Sit to Stand    Sit on edge of chair, feet flat on floor. Stand upright, extending knees fully. Repeat 10 times per set. Do 2 sets per session.  http://orth.exer.us/734   Copyright  VHI. All rights reserved.

## 2016-12-17 ENCOUNTER — Ambulatory Visit (HOSPITAL_COMMUNITY): Payer: Self-pay

## 2016-12-17 ENCOUNTER — Encounter (HOSPITAL_COMMUNITY): Payer: Self-pay

## 2016-12-17 DIAGNOSIS — M25662 Stiffness of left knee, not elsewhere classified: Secondary | ICD-10-CM

## 2016-12-17 DIAGNOSIS — M25652 Stiffness of left hip, not elsewhere classified: Secondary | ICD-10-CM

## 2016-12-17 DIAGNOSIS — R2689 Other abnormalities of gait and mobility: Secondary | ICD-10-CM

## 2016-12-17 DIAGNOSIS — M25672 Stiffness of left ankle, not elsewhere classified: Secondary | ICD-10-CM
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
## 2016-12-23 ENCOUNTER — Ambulatory Visit (HOSPITAL_COMMUNITY): Payer: Self-pay | Attending: Orthopedic Surgery

## 2016-12-23 DIAGNOSIS — M25652 Stiffness of left hip, not elsewhere classified: Secondary | ICD-10-CM

## 2016-12-23 DIAGNOSIS — M25662 Stiffness of left knee, not elsewhere classified: Secondary | ICD-10-CM

## 2016-12-23 DIAGNOSIS — R2689 Other abnormalities of gait and mobility: Secondary | ICD-10-CM

## 2016-12-23 DIAGNOSIS — M25672 Stiffness of left ankle, not elsewhere classified: Secondary | ICD-10-CM
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
## 2016-12-25 ENCOUNTER — Ambulatory Visit (HOSPITAL_COMMUNITY): Payer: Self-pay | Admitting: Physical Therapy

## 2016-12-25 DIAGNOSIS — M25662 Stiffness of left knee, not elsewhere classified: Secondary | ICD-10-CM

## 2016-12-25 DIAGNOSIS — R2689 Other abnormalities of gait and mobility: Secondary | ICD-10-CM

## 2016-12-25 DIAGNOSIS — M25652 Stiffness of left hip, not elsewhere classified: Secondary | ICD-10-CM

## 2016-12-25 DIAGNOSIS — M25672 Stiffness of left ankle, not elsewhere classified: Secondary | ICD-10-CM
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
## 2016-12-26 NOTE — Addendum Note (Signed)
Addendum  created 12/26/16 1033 by Bobbi Kozakiewicz, MD   Sign clinical note    

## 2016-12-30 ENCOUNTER — Ambulatory Visit (HOSPITAL_COMMUNITY): Payer: Self-pay

## 2016-12-30 DIAGNOSIS — M25652 Stiffness of left hip, not elsewhere classified: Secondary | ICD-10-CM

## 2016-12-30 DIAGNOSIS — M25662 Stiffness of left knee, not elsewhere classified: Secondary | ICD-10-CM

## 2016-12-30 DIAGNOSIS — R2689 Other abnormalities of gait and mobility: Secondary | ICD-10-CM

## 2016-12-30 DIAGNOSIS — M25672 Stiffness of left ankle, not elsewhere classified: Secondary | ICD-10-CM
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
## 2017-01-01 ENCOUNTER — Ambulatory Visit (HOSPITAL_COMMUNITY): Payer: Self-pay

## 2017-01-01 DIAGNOSIS — M25652 Stiffness of left hip, not elsewhere classified: Secondary | ICD-10-CM

## 2017-01-01 DIAGNOSIS — M25672 Stiffness of left ankle, not elsewhere classified: Secondary | ICD-10-CM

## 2017-01-01 DIAGNOSIS — R2689 Other abnormalities of gait and mobility: Secondary | ICD-10-CM

## 2017-01-01 DIAGNOSIS — M25662 Stiffness of left knee, not elsewhere classified: Secondary | ICD-10-CM
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
## 2017-01-01 NOTE — Patient Instructions (Signed)
SINGLE LIMB STANCE    Stance: single leg on floor. Raise leg. Hold 60 seconds. Repeat with other leg. Can complete on pillow or foam.    Copyright  VHI. All rights reserved.   Vector stance  Standing single leg stance each LE bring opposite leg forward, abduct to side then extend hip; hold for 10" BLE 5 reps  Band Walk: Side Stepping    Tie band around calves, just below knees. Side step to one side, then step back to start. Note: Small towel between band and skin eases rubbing.  Calf Stretch    Place hands on wall at shoulder height. Keeping back leg straight, bend front leg, feet pointing forward, heels flat on floor. Lean forward slightly until stretch is felt in calf of back leg. Hold stretch 30 seconds, breathing slowly in and out. Repeat stretch with other leg back. Do 3 sessions per day. Variation: Use chair or table for support.  Copyright  VHI. All rights reserved.   http://plyo.exer.us/76   Copyright  VHI. All rights reserved.

## 2017-01-06 ENCOUNTER — Ambulatory Visit (HOSPITAL_COMMUNITY): Payer: Self-pay

## 2017-01-08 ENCOUNTER — Encounter (HOSPITAL_COMMUNITY): Payer: Self-pay

## 2017-02-12 ENCOUNTER — Encounter: Payer: Self-pay | Admitting: *Deleted

## 2017-02-12 ENCOUNTER — Ambulatory Visit (INDEPENDENT_AMBULATORY_CARE_PROVIDER_SITE_OTHER): Payer: Managed Care, Other (non HMO) | Admitting: Cardiology

## 2017-02-12 VITALS — BP 159/96 | HR 76 | Ht 69.0 in | Wt 177.0 lb

## 2017-02-12 DIAGNOSIS — R0602 Shortness of breath: Secondary | ICD-10-CM | POA: Diagnosis not present

## 2017-02-12 DIAGNOSIS — R079 Chest pain, unspecified: Secondary | ICD-10-CM | POA: Diagnosis not present
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
## 2017-02-12 NOTE — Patient Instructions (Signed)
Your physician recommends that you schedule a follow-up appointment in: TO BE DETERMINED AFTER TESTING WITH DR BRANCH  Your physician recommends that you continue on your current medications as directed. Please refer to the Current Medication list given to you today.  Your physician has requested that you have a lexiscan myoview. For further information please visit [email or web address]. Please follow instruction sheet, as given.  Thank you for choosing [facility]!!    

## 2017-02-16 ENCOUNTER — Telehealth: Payer: Self-pay | Admitting: Cardiology

## 2017-02-16 NOTE — Telephone Encounter (Signed)
Pre-cert Verification for the following procedure   [facility]

## 2017-02-19 ENCOUNTER — Other Ambulatory Visit: Payer: Self-pay | Admitting: Orthopedic Surgery

## 2017-02-19 DIAGNOSIS — M1612 Unilateral primary osteoarthritis, left hip: Secondary | ICD-10-CM

## 2017-02-20 ENCOUNTER — Encounter (HOSPITAL_COMMUNITY)
Admission: RE | Admit: 2017-02-20 | Discharge: 2017-02-20 | Disposition: A | Discharge status: Home / Self Care | Payer: Managed Care, Other (non HMO) | Source: Ambulatory Visit | Attending: Cardiology | Admitting: Cardiology

## 2017-02-20 ENCOUNTER — Encounter (HOSPITAL_BASED_OUTPATIENT_CLINIC_OR_DEPARTMENT_OTHER)
Admission: RE | Admit: 2017-02-20 | Discharge: 2017-02-20 | Disposition: A | Discharge status: Home / Self Care | Payer: Managed Care, Other (non HMO) | Source: Ambulatory Visit | Attending: Cardiology | Admitting: Cardiology

## 2017-02-20 ENCOUNTER — Encounter (HOSPITAL_COMMUNITY): Payer: Self-pay

## 2017-02-20 DIAGNOSIS — R079 Chest pain, unspecified: Secondary | ICD-10-CM | POA: Diagnosis not present

## 2017-02-20 LAB — NM MYOCAR MULTI W/SPECT W/WALL MOTION / EF
CHL CUP NUCLEAR SRS: 0
CHL CUP NUCLEAR SSS: 5
CHL CUP RESTING HR STRESS: 59 {beats}/min
CSEPPHR: 108 {beats}/min
LV sys vol: 90 mL
LVDIAVOL: 144 mL (ref 62–150)
NUC STRESS TID: 1.11
RATE: 0.32
SDS: 5

## 2017-02-20 MED ORDER — TECHNETIUM TC 99M TETROFOSMIN IV KIT
30.0000 | PACK | Freq: Once | INTRAVENOUS | Status: AC | PRN
  Administered 2017-02-20: 30 via INTRAVENOUS

## 2017-02-20 MED ORDER — TECHNETIUM TC 99M TETROFOSMIN IV KIT
10.0000 | PACK | Freq: Once | INTRAVENOUS | Status: AC | PRN
  Administered 2017-02-20: 10 via INTRAVENOUS

## 2017-02-20 MED ORDER — SODIUM CHLORIDE 0.9% FLUSH
INTRAVENOUS | Status: AC
  Administered 2017-02-20: 10 mL via INTRAVENOUS
  Filled 2017-02-20: qty 10

## 2017-02-20 MED ORDER — REGADENOSON 0.4 MG/5ML IV SOLN
INTRAVENOUS | Status: AC
  Administered 2017-02-20: 0.4 mg via INTRAVENOUS
  Filled 2017-02-20: qty 5

## 2017-02-24 ENCOUNTER — Telehealth: Payer: Self-pay | Admitting: *Deleted

## 2017-02-24 DIAGNOSIS — R9439 Abnormal result of other cardiovascular function study: Secondary | ICD-10-CM

## 2017-02-24 NOTE — Telephone Encounter (Signed)
-----   Message from Antoine PocheJonathan F Branch, MD sent at 02/23/2017  2:57 PM EDT ----- Stress test does not show strong evidence of any major blockages but does suggest the pumping function of the heart may be decreased. Please order an echo for chest pain to better evaluate   J BrancH MD

## 2017-02-24 NOTE — Telephone Encounter (Signed)
Pre-cert Verification for the following procedure   °Echo set for 03-12-17  °

## 2017-02-24 NOTE — Telephone Encounter (Signed)
Pt aware and agreeable to echo - orders placed and will forward to schedulers - results routed to pcp

## 2017-02-27 ENCOUNTER — Ambulatory Visit
Admission: RE | Admit: 2017-02-27 | Discharge: 2017-02-27 | Disposition: A | Discharge status: Home / Self Care | Payer: Managed Care, Other (non HMO) | Source: Ambulatory Visit | Attending: Orthopedic Surgery | Admitting: Orthopedic Surgery

## 2017-02-27 DIAGNOSIS — M1612 Unilateral primary osteoarthritis, left hip: Secondary | ICD-10-CM

## 2017-02-27 MED ORDER — IOPAMIDOL (ISOVUE-M 200) INJECTION 41%
1.0000 mL | Freq: Once | INTRAMUSCULAR | Status: AC
  Administered 2017-02-27: 1 mL via INTRA_ARTICULAR

## 2017-02-27 MED ORDER — METHYLPREDNISOLONE ACETATE 40 MG/ML INJ SUSP (RADIOLOG
120.0000 mg | Freq: Once | INTRAMUSCULAR | Status: AC
  Administered 2017-02-27: 120 mg via INTRA_ARTICULAR

## 2017-03-03 ENCOUNTER — Encounter: Payer: Self-pay | Admitting: Internal Medicine

## 2017-03-12 ENCOUNTER — Ambulatory Visit (INDEPENDENT_AMBULATORY_CARE_PROVIDER_SITE_OTHER): Payer: Managed Care, Other (non HMO)

## 2017-03-12 ENCOUNTER — Other Ambulatory Visit: Payer: Self-pay

## 2017-03-12 DIAGNOSIS — R9439 Abnormal result of other cardiovascular function study: Secondary | ICD-10-CM | POA: Diagnosis not present

## 2017-03-20 ENCOUNTER — Telehealth: Payer: Self-pay | Admitting: Cardiology

## 2017-03-20 NOTE — Telephone Encounter (Signed)
Reading physician: Antoine PocheBranch, Jonathan F, MD Ordering physician: Antoine PocheBranch, Jonathan F, MD Study date: 03/12/17  Result Notes   Notes recorded by Albertine PatriciaAshworth, Staci T, CMA on 03/19/2017 at 3:46 PM EDT LM to return call ------  Notes recorded by Antoine PocheBranch, Jonathan F, MD on 03/16/2017 at 3:56 PM EDT Echo looks good, shows normal heart function. Overall heart testing has been reassuring. He should f/u in 6 weeks. How are his symptoms doing?  J BrancH MD        03/20/17 [id number]

## 2017-03-20 NOTE — Telephone Encounter (Signed)
New Message     This call came through answering service 03/19/17 704pm     Pt was returning a call for results

## 2017-03-25 ENCOUNTER — Telehealth: Payer: Self-pay | Admitting: *Deleted

## 2017-03-25 NOTE — Telephone Encounter (Signed)
Pt aware - denies any symptoms at this time - 6 weeks f/u scheduled - routed to pcp

## 2017-03-25 NOTE — Telephone Encounter (Signed)
-----   Message from Antoine PocheJonathan F Branch, MD sent at 03/16/2017  3:56 PM EDT ----- Echo looks good, shows normal heart function. Overall heart testing has been reassuring. He should f/u in 6 weeks. How are his symptoms doing?  J BrancH MD

## 2017-04-08 ENCOUNTER — Encounter: Payer: Self-pay | Admitting: Physician Assistant

## 2017-04-08 ENCOUNTER — Ambulatory Visit: Payer: Self-pay | Admitting: Physician Assistant

## 2017-04-08 ENCOUNTER — Other Ambulatory Visit: Payer: Self-pay | Admitting: Orthopedic Surgery

## 2017-04-08 VITALS — BP 112/74 | HR 85 | Temp 98.1°F | Wt 179.0 lb

## 2017-04-08 DIAGNOSIS — B1920 Unspecified viral hepatitis C without hepatic coma: Secondary | ICD-10-CM

## 2017-04-08 DIAGNOSIS — I10 Essential (primary) hypertension: Secondary | ICD-10-CM

## 2017-04-08 DIAGNOSIS — E785 Hyperlipidemia, unspecified: Secondary | ICD-10-CM

## 2017-04-08 DIAGNOSIS — F17219 Nicotine dependence, cigarettes, with unspecified nicotine-induced disorders: Secondary | ICD-10-CM

## 2017-04-08 DIAGNOSIS — R7303 Prediabetes: Secondary | ICD-10-CM

## 2017-04-08 MED ORDER — PRAVASTATIN SODIUM 40 MG PO TABS: 40.0000 mg | ORAL_TABLET | Freq: Every day | ORAL | 1 refills | Status: DC

## 2017-04-08 NOTE — Progress Notes (Signed)
BP 112/74 (BP Location: Left Arm, Patient Position: Sitting, Cuff Size: Normal)   Pulse 85   Temp 98.1 F (36.7 C)   Wt 179 lb (81.2 kg)   SpO2 97%   BMI 26.43 kg/m    Subjective:    Patient ID: Bryan Levy, male    DOB: Feb 10, 1951, 66 y.o.   MRN: [id number]  HPI: ALFONSA VAILE is a 66 y.o. male presenting on 04/08/2017 for Follow-up   HPI   I am seeing pt at [facility] clinic as interim provider until new permanent provider takes over mid-October  Pt scheduled for hip replacement soon and also has appointment with Dr Wyline Mood for his CAD.  Pt thinks he has been out of pravastin 4-5 days.   labwork done in July shows that pt has hepatitis.  He has been referred to GI and has appointment soon.   labwork done in July also shows pt to have prediabetes.  Pt also has history of GI bleed  Relevant past medical, surgical, family and social history reviewed and updated as indicated. Interim medical history since our last visit reviewed. Allergies and medications reviewed and updated.   Current Outpatient Prescriptions:  .  acetaminophen (TYLENOL) 325 MG tablet, Take 2 tablets (650 mg total) by mouth every 6 (six) hours as needed for mild pain., Disp: , Rfl:  .  amLODipine (NORVASC) 5 MG tablet, Take 5 mg by mouth daily., Disp: , Rfl:  .  aspirin EC 81 MG tablet, Take 81 mg by mouth daily., Disp: , Rfl:  .  carvedilol (COREG) 6.25 MG tablet, Take 6.25 mg by mouth 2 (two) times daily with a meal., Disp: , Rfl:  .  cetirizine (ZYRTEC) 10 MG tablet, Take 10 mg by mouth daily., Disp: , Rfl:  .  cyanocobalamin (BL VITAMIN B-12) 500 MCG tablet, Take 1,000 mcg by mouth daily.  , Disp: , Rfl:  .  ergocalciferol (VITAMIN D2) 50000 units capsule, Take 50,000 Units by mouth every other day. For 12 weeks , Disp: , Rfl:  .  Omega-3 Fatty Acids (FISH OIL PO), Take 1 capsule by mouth every other day., Disp: , Rfl:  .  pantoprazole (PROTONIX) 40 MG tablet, Take 40 mg by mouth daily., Disp: ,  Rfl:  .  pravastatin (PRAVACHOL) 40 MG tablet, Take 1 tablet (40 mg total) by mouth daily., Disp: 30 tablet, Rfl: 1   Review of Systems  Constitutional: Positive for unexpected weight change. Negative for appetite change, chills, diaphoresis, fatigue and fever.  HENT: Negative for congestion, drooling, ear pain, facial swelling, hearing loss, mouth sores, sneezing, sore throat, trouble swallowing and voice change.   Eyes: Negative for pain, discharge, redness, itching and visual disturbance.  Respiratory: Negative for cough, choking, shortness of breath and wheezing.   Cardiovascular: Negative for chest pain, palpitations and leg swelling.  Gastrointestinal: Negative for abdominal pain, blood in stool, constipation, diarrhea and vomiting.  Endocrine: Negative for cold intolerance, heat intolerance and polydipsia.  Genitourinary: Negative for decreased urine volume, dysuria and hematuria.  Musculoskeletal: Positive for arthralgias, back pain and gait problem.  Skin: Negative for rash.  Allergic/Immunologic: Negative for environmental allergies.  Neurological: Negative for seizures, syncope, light-headedness and headaches.  Hematological: Negative for adenopathy.  Psychiatric/Behavioral: Negative for agitation, dysphoric mood and suicidal ideas. The patient is not nervous/anxious.     Per HPI unless specifically indicated above     Objective:    BP 112/74 (BP Location: Left Arm, Patient Position: Sitting, Cuff  Size: Normal)   Pulse 85   Temp 98.1 F (36.7 C)   Wt 179 lb (81.2 kg)   SpO2 97%   BMI 26.43 kg/m   Wt Readings from Last 3 Encounters:  04/08/17 179 lb (81.2 kg)  02/12/17 177 lb (80.3 kg)  09/10/16 167 lb 8.8 oz (76 kg)    Physical Exam  Constitutional: He is oriented to person, place, and time. He appears well-developed and well-nourished.  HENT:  Head: Normocephalic and atraumatic.  Neck: Neck supple.  Cardiovascular: Normal rate and regular rhythm.    Pulmonary/Chest: Effort normal and breath sounds normal. He has no wheezes.  Abdominal: Soft. Bowel sounds are normal. There is no hepatosplenomegaly. There is no tenderness.  Musculoskeletal: He exhibits no edema.  Lymphadenopathy:    He has no cervical adenopathy.  Neurological: He is alert and oriented to person, place, and time. Gait (due to hip pain) abnormal.  Skin: Skin is warm and dry.  Psychiatric: He has a normal mood and affect. His behavior is normal.  Vitals reviewed.       Assessment & Plan:    Encounter Diagnoses  Name Primary?  . Essential hypertension Yes  . Prediabetes   . Dyslipidemia   . Hepatitis C virus infection without hepatic coma, unspecified chronicity   . Cigarette nicotine dependence with nicotine-induced disorder     -Pt already has appt with GI for hepatitis C -pt is counseled on prediabetes and discussed helpful strategies to slow the progression to diabetes -pt pravastatin refilled and he is counseled to avoid running out of it -no changes to medications today -pt to follow up with dr Wyline Mood as scheduled -pt to follow up 2 months.  RTO sooner prn

## 2017-04-22 ENCOUNTER — Encounter: Payer: Self-pay | Admitting: Gastroenterology

## 2017-04-22 ENCOUNTER — Ambulatory Visit (INDEPENDENT_AMBULATORY_CARE_PROVIDER_SITE_OTHER): Payer: Managed Care, Other (non HMO) | Admitting: Gastroenterology

## 2017-04-22 DIAGNOSIS — B182 Chronic viral hepatitis C: Secondary | ICD-10-CM

## 2017-04-22 DIAGNOSIS — M1612 Unilateral primary osteoarthritis, left hip: Secondary | ICD-10-CM | POA: Diagnosis present
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
## 2017-04-22 NOTE — Progress Notes (Addendum)
ZOX:WRUEAV, Katy Apo, FNP  Cardiologist: not currently, will be seeing Wayne Sever, MD at the end of month for check up-MI 2008  EKG: 02/12/17 in [facility]  Stress test: pt denies  ECHO: 03/12/17 in EPIC  Cardiac Cath: pt denies  Chest x-ray: 1 view 09/09/16 in EPIC  Pt  had a GI bleed in August 2018 and received 2 units PRBCs, we will have to wait and type and screen day of surgery per policy. (in care everywhere)

## 2017-04-22 NOTE — Pre-Procedure Instructions (Signed)
Bryan Levy  04/22/2017      [facility] [address] [place] - [address] [place] [address] Phone: [phone] Fax: [phone]    Your procedure is scheduled on May 04, 2017.  Report to [facility] Admitting at 530 AM.  Call this number if you have problems the morning of surgery:  [phone]   Remember:  Do not eat food or drink liquids after midnight.  Take these medicines the morning of surgery with A SIP OF WATER carvedilol (coreg), amlodipine (norvasc), cetirizine (zyrtec), pantoprazole (protonix).  7 days prior to surgery STOP taking any Aspirin (unless otherwise instructed by your surgeon), Aleve, Naproxen, Ibuprofen, Motrin, Advil, Goody's, BC's, all herbal medications, fish oil, and all vitamins   Do not wear jewelry, make-up or nail polish.  Do not wear lotions, powders, or perfumes, or deoderant.  Men may shave face and neck.  Do not bring valuables to the hospital.  [facility] is not responsible for any belongings or valuables.  Contacts, dentures or bridgework may not be worn into surgery.  Leave your suitcase in the car.  After surgery it may be brought to your room.  For patients admitted to the hospital, discharge time will be determined by your treatment team.  Patients discharged the day of surgery will not be allowed to drive home.    Special instructions:   [place]- Preparing For Surgery  Before surgery, you can play an important role. Because skin is not sterile, your skin needs to be as free of germs as possible. You can reduce the number of germs on your skin by washing with CHG (chlorahexidine gluconate) Soap before surgery.  CHG is an antiseptic cleaner which kills germs and bonds with the skin to continue killing germs even after washing.  Please do not use if you have an allergy to CHG or antibacterial soaps. If your skin becomes reddened/irritated stop using the CHG.  Do not shave (including legs  and underarms) for at least 48 hours prior to first CHG shower. It is OK to shave your face.  Please follow these instructions carefully.   1. Shower the NIGHT BEFORE SURGERY and the MORNING OF SURGERY with CHG.   2. If you chose to wash your hair, wash your hair first as usual with your normal shampoo.  3. After you shampoo, rinse your hair and body thoroughly to remove the shampoo.  4. Use CHG as you would any other liquid soap. You can apply CHG directly to the skin and wash gently with a scrungie or a clean washcloth.   5. Apply the CHG Soap to your body ONLY FROM THE NECK DOWN.  Do not use on open wounds or open sores. Avoid contact with your eyes, ears, mouth and genitals (private parts). Wash genitals (private parts) with your normal soap.  USE REGULAR SHAMPOO AND CONDITIONER FOR HAIR USE REGULAR SOAP FOR FACE AND PRIVATE AREA  6. Wash thoroughly, paying special attention to the area where your surgery will be performed.  7. Thoroughly rinse your body with warm water from the neck down.  8. DO NOT shower/wash with your normal soap after using and rinsing off the CHG Soap.  9. Pat yourself dry with a CLEAN TOWEL and WASH CLOTH  10. Wear CLEAN PAJAMAS to bed the night before surgery, wear comfortable clothes the morning of surgery  11. Place CLEAN SHEETS on your bed the night of your first  shower and DO NOT SLEEP WITH PETS.    Day of Surgery: Do not apply any deodorants/lotions. Please wear clean clothes to the hospital/surgery center.     Please read over the following fact sheets that you were given. Pain Booklet, Coughing and Deep Breathing, MRSA Information and Surgical Site Infection Prevention

## 2017-04-22 NOTE — Progress Notes (Signed)
Reviewed [facility] records. Transferred from OSH for acute UGI bleed. CT abd for abd pain without acute process. Received 2 units of prbcs prior to transfer. At [facility], EGD with ERE, potential M-W tear, H.pylori gastritis on bx. At time of discharge Hgb 9.6. Amoxicillin, clarithromycin, pantoprazole bid provided in 02/2017.

## 2017-04-22 NOTE — Patient Instructions (Signed)
1. You need to avoid sharing needles, razors, nailclippers, toothbrushes.  2. Please have your labs and ultrasound done. 3. You need to cut back on ALL types of alcohol use IF you want to consider hepatitis C treatment. It is also bad for liver health. 4. Return to the office in 6-8 weeks after you recover from your hip surgery.  5. I will look at your records from [place].

## 2017-04-22 NOTE — Progress Notes (Signed)
Primary Care Physician:  Hattie Perch, FNP  Primary Gastroenterologist:  Roetta Sessions, MD   Chief Complaint  Patient presents with  . Hepatitis C    HPI:  Bryan Levy is a 67 y.o. male here at the request of Tomma Rakers, FNP for further management of HCV. Recent LFTs elevated and HCV Ab+. HCV RNA qualitative positive and genotype 1a.   Patient states he has been in prison. Released in the last 80s. Tattoos placed while in prison. H/o remote intranasal drug use. None in years. No known blood transfusions.   No prior h/o jaundice. Denies abd pain. BM regular. No heartburn, dysphagia, vomiting, unintentional weight loss.   He reports hospitalization at [facility] about one month ago for UGI bleed (hematemesis and melena).   Scheduled for hip replacement 05/04/17 at [facility].  Pint of wine every other day. H/O multiple DUIs. Longest stent without etoh about one year. Drinking regularly for over 20 years.      Current Outpatient Prescriptions  Medication Sig Dispense Refill  . amLODipine (NORVASC) 5 MG tablet Take 5 mg by mouth daily.    Marland Kitchen aspirin EC 81 MG tablet Take 81 mg by mouth daily.    . carvedilol (COREG) 6.25 MG tablet Take 6.25 mg by mouth 2 (two) times daily with a meal.    . cetirizine (ZYRTEC) 10 MG tablet Take 10 mg by mouth daily.    . Cholecalciferol (VITAMIN D3 PO) Take 1 capsule by mouth every other day.    . cyanocobalamin (BL VITAMIN B-12) 500 MCG tablet Take 1,000 mcg by mouth daily.      . Omega-3 Fatty Acids (FISH OIL PO) Take 1 capsule by mouth every other day.    . pantoprazole (PROTONIX) 40 MG tablet Take 40 mg by mouth daily.    . pravastatin (PRAVACHOL) 40 MG tablet Take 1 tablet (40 mg total) by mouth daily. 30 tablet 1  . acetaminophen (TYLENOL) 325 MG tablet Take 2 tablets (650 mg total) by mouth every 6 (six) hours as needed for mild pain. (Patient not taking: Reported on 04/21/2017)     No current facility-administered medications for this visit.      Allergies as of 04/22/2017 - Review Complete 04/22/2017  Allergen Reaction Noted  . Lisinopril Swelling 04/08/2017    Past Medical History:  Diagnosis Date  . Ankle dislocation, left, initial encounter 09/12/2016  . Chest pain, unspecified   . Closed posterior dislocation of left hip (HCC) 09/12/2016  . GERD (gastroesophageal reflux disease)   . Heart attack (HCC) 2008  . Hyperlipidemia   . Hypertension   . Intermediate coronary syndrome (HCC)   . Personal history of tobacco use, presenting hazards to health   . Polysubstance abuse (HCC)   . Shortness of breath     Past Surgical History:  Procedure Laterality Date  . IRRIGATION AND DEBRIDEMENT KNEE Left 09/11/2016   Procedure: IRRIGATION AND DEBRIDEMENT LEFT KNEE;  Surgeon: Myrene Galas, MD;  Location: [facility] OR;  Service: Orthopedics;  Laterality: Left;  . ORIF ACETABULAR FRACTURE Left 09/11/2016   Procedure: OPEN REDUCTION INTERNAL FIXATION (ORIF) ACETABULAR FRACTURE;  Surgeon: Myrene Galas, MD;  Location: [facility] OR;  Service: Orthopedics;  Laterality: Left;  . ORIF ANKLE FRACTURE Left 09/11/2016   Procedure: OPEN REDUCTION INTERNAL FIXATION (ORIF) LEFT ANKLE FRACTURE;  Surgeon: Myrene Galas, MD;  Location: [facility] OR;  Service: Orthopedics;  Laterality: Left;  . TONSILLECTOMY      Family History  Problem Relation Age of Onset  .  Hypertension Mother   . Hypertension Sister   . Coronary artery disease Unknown        NEGATIVE     Social History   Social History  . Marital status: Married    Spouse name: N/A  . Number of children: N/A  . Years of education: N/A   Occupational History  . Not on file.   Social History Main Topics  . Smoking status: Current Every Day Smoker    Packs/day: 1.00    Years: 48.00    Types: Cigarettes  . Smokeless tobacco: Never Used     Comment: 15 cigs a day  . Alcohol use Yes     Comment: pint of wine every few days  . Drug use: No     Comment: Polysubstance abuse (tobacco, alcohol and  cocaine) Denies cocaine use in greater than 5 years (04/22/2017)  . Sexual activity: Not on file   Other Topics Concern  . Not on file   Social History Narrative   ** Merged History Encounter **       Polysubstance abuse (tobacco, alcohol and cocaine) In the past      ROS:  General: Negative for anorexia, weight loss, fever, chills, fatigue, weakness. Eyes: Negative for vision changes.  ENT: Negative for hoarseness, difficulty swallowing , nasal congestion. CV: Negative for chest pain, angina, palpitations, dyspnea on exertion, peripheral edema.  Respiratory: Negative for dyspnea at rest, dyspnea on exertion, cough, sputum, wheezing.  GI: See history of present illness. GU:  Negative for dysuria, hematuria, urinary incontinence, urinary frequency, nocturnal urination.  MS: +for joint pain,no back pain Derm: Negative for rash or itching.  Neuro: Negative for weakness, abnormal sensation, seizure, frequent headaches, memory loss, confusion.  Psych: Negative for anxiety, depression, suicidal ideation, hallucinations.  Endo: Negative for unusual weight change.  Heme: Negative for bruising or bleeding. Allergy: Negative for rash or hives.    Physical Examination:  BP 138/84   Pulse 75   Temp 97.6 F (36.4 C) (Oral)   Ht  (1.753 m)   Wt 179 lb 12.8 oz (81.6 kg)   BMI 26.55 kg/m    General: Well-nourished, well-developed in no acute distress.  Head: Normocephalic, atraumatic.   Eyes: Conjunctiva pink, no icterus. Mouth: Oropharyngeal mucosa moist and pink , no lesions erythema or exudate. Neck: Supple without thyromegaly, masses, or lymphadenopathy.  Lungs: Clear to auscultation bilaterally.  Heart: Regular rate and rhythm, no murmurs rubs or gallops.  Abdomen: Bowel sounds are normal, nontender, nondistended, no hepatosplenomegaly or masses, no abdominal bruits or    hernia , no rebound or guarding.   Rectal: not performed  Extremities: No lower extremity edema. No  clubbing or deformities.  Neuro: Alert and oriented x 4 , grossly normal neurologically.  Skin: Warm and dry, no rash or jaundice.   Psych: Alert and cooperative, normal mood and affect.  Labs: See above  Imaging Studies: No results found.

## 2017-04-23 ENCOUNTER — Encounter (HOSPITAL_COMMUNITY): Payer: Self-pay

## 2017-04-23 ENCOUNTER — Encounter (HOSPITAL_COMMUNITY)
Admission: RE | Admit: 2017-04-23 | Discharge: 2017-04-23 | Disposition: A | Discharge status: Home / Self Care | Payer: Managed Care, Other (non HMO) | Source: Ambulatory Visit | Attending: Orthopedic Surgery | Admitting: Orthopedic Surgery

## 2017-04-23 DIAGNOSIS — I200 Unstable angina: Secondary | ICD-10-CM

## 2017-04-23 DIAGNOSIS — Z01812 Encounter for preprocedural laboratory examination: Secondary | ICD-10-CM | POA: Diagnosis present

## 2017-04-23 HISTORY — DX: Unstable angina: I20.0

## 2017-04-23 LAB — BASIC METABOLIC PANEL
ANION GAP: 10 (ref 5–15)
BUN: 11 mg/dL (ref 6–20)
CALCIUM: 9.4 mg/dL (ref 8.9–10.3)
CO2: 24 mmol/L (ref 22–32)
Chloride: 104 mmol/L (ref 101–111)
Creatinine, Ser: 0.97 mg/dL (ref 0.61–1.24)
GFR calc Af Amer: >60 mL/min (ref >60)
GLUCOSE: 100 mg/dL — AB (ref 65–99)
Potassium: 4 mmol/L (ref 3.5–5.1)
SODIUM: 138 mmol/L (ref 135–145)

## 2017-04-23 LAB — CBC WITH DIFFERENTIAL/PLATELET
BASOS ABS: 0 10*3/uL (ref 0.0–0.1)
BASOS PCT: 0 %
EOS PCT: 2 %
Eosinophils Absolute: 0.1 10*3/uL (ref 0.0–0.7)
HEMATOCRIT: 38 % — AB (ref 39.0–52.0)
Hemoglobin: 12.4 g/dL — ABNORMAL LOW (ref 13.0–17.0)
Lymphocytes Relative: 33 %
Lymphs Abs: 2 10*3/uL (ref 0.7–4.0)
MCH: 28.1 pg (ref 26.0–34.0)
MCHC: 32.6 g/dL (ref 30.0–36.0)
MCV: 86.2 fL (ref 78.0–100.0)
MONO ABS: 0.5 10*3/uL (ref 0.1–1.0)
Monocytes Relative: 9 %
NEUTROS ABS: 3.5 10*3/uL (ref 1.7–7.7)
Neutrophils Relative %: 56 %
Platelets: 262 10*3/uL (ref 150–400)
RBC: 4.41 MIL/uL (ref 4.22–5.81)
RDW: 14.2 % (ref 11.5–15.5)
WBC: 6.2 10*3/uL (ref 4.0–10.5)

## 2017-04-23 LAB — PROTIME-INR
INR: 1.03
Prothrombin Time: 13.4 seconds (ref 11.4–15.2)

## 2017-04-23 LAB — URINALYSIS, ROUTINE W REFLEX MICROSCOPIC
Bilirubin Urine: NEGATIVE
Glucose, UA: NEGATIVE mg/dL
HGB URINE DIPSTICK: NEGATIVE
Ketones, ur: NEGATIVE mg/dL
Leukocytes, UA: NEGATIVE
NITRITE: NEGATIVE
PROTEIN: NEGATIVE mg/dL
SPECIFIC GRAVITY, URINE: 1.018 (ref 1.005–1.030)
pH: 6 (ref 5.0–8.0)

## 2017-04-23 LAB — APTT: APTT: 27 s (ref 24–36)

## 2017-04-23 LAB — SURGICAL PCR SCREEN
MRSA, PCR: NEGATIVE
STAPHYLOCOCCUS AUREUS: NEGATIVE

## 2017-04-24 ENCOUNTER — Telehealth: Payer: Self-pay | Admitting: *Deleted

## 2017-04-24 ENCOUNTER — Encounter (HOSPITAL_COMMUNITY): Payer: Self-pay

## 2017-04-24 DIAGNOSIS — Z01812 Encounter for preprocedural laboratory examination: Secondary | ICD-10-CM | POA: Diagnosis not present

## 2017-04-24 LAB — HEPATIC FUNCTION PANEL
ALBUMIN: 4.2 g/dL (ref 3.5–5.0)
ALK PHOS: 117 U/L (ref 38–126)
ALT: 61 U/L (ref 17–63)
AST: 61 U/L — ABNORMAL HIGH (ref 15–41)
Bilirubin, Direct: 0.1 mg/dL (ref 0.1–0.5)
Indirect Bilirubin: 0.4 mg/dL (ref 0.3–0.9)
TOTAL PROTEIN: 7.4 g/dL (ref 6.5–8.1)
Total Bilirubin: 0.5 mg/dL (ref 0.3–1.2)

## 2017-04-24 NOTE — Telephone Encounter (Signed)
Ortho wanted to know if pt was cleared for hip surgery Dr Turner Daniels - echo and stress test done recently. Routed to Dr Purvis Sheffield Dr Wyline Mood out of office
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
## 2017-04-25 NOTE — Telephone Encounter (Signed)
Should be able to proceed. Normal LV systolic function. Stress test already reviewed by Dr. Wyline Mood.

## 2017-04-26 NOTE — Telephone Encounter (Signed)
Yes patient is ok for surgery   Dominga Ferry MD

## 2017-04-27 ENCOUNTER — Encounter: Payer: Self-pay | Admitting: Gastroenterology

## 2017-04-28 NOTE — Telephone Encounter (Signed)
Routed to [employer]

## 2017-04-29 ENCOUNTER — Ambulatory Visit (HOSPITAL_COMMUNITY): Admission: RE | Admit: 2017-04-29 | Payer: Managed Care, Other (non HMO) | Source: Ambulatory Visit

## 2017-05-01 MED ORDER — BUPIVACAINE LIPOSOME 1.3 % IJ SUSP
20.0000 mL | Freq: Once | INTRAMUSCULAR | Status: AC
  Administered 2017-05-04: 20 mL
  Filled 2017-05-01 (×2): qty 20

## 2017-05-01 MED ORDER — TRANEXAMIC ACID 1000 MG/10ML IV SOLN
2000.0000 mg | INTRAVENOUS | Status: AC
  Administered 2017-05-04: 2000 mg via TOPICAL
  Filled 2017-05-01: qty 20

## 2017-05-01 MED ORDER — TRANEXAMIC ACID 1000 MG/10ML IV SOLN
1000.0000 mg | INTRAVENOUS | Status: AC
  Administered 2017-05-04: 1000 mg via INTRAVENOUS
  Filled 2017-05-01: qty 1100

## 2017-05-01 MED ORDER — CEFAZOLIN SODIUM-DEXTROSE 2-4 GM/100ML-% IV SOLN
2.0000 g | INTRAVENOUS | Status: AC
  Administered 2017-05-04: 2 g via INTRAVENOUS
  Filled 2017-05-01: qty 100

## 2017-05-04 ENCOUNTER — Inpatient Hospital Stay (HOSPITAL_COMMUNITY): Payer: Managed Care, Other (non HMO) | Admitting: Vascular Surgery

## 2017-05-04 ENCOUNTER — Encounter (HOSPITAL_COMMUNITY): Payer: Self-pay | Admitting: Anesthesiology

## 2017-05-04 ENCOUNTER — Inpatient Hospital Stay (HOSPITAL_COMMUNITY): Payer: Managed Care, Other (non HMO)

## 2017-05-04 ENCOUNTER — Encounter (HOSPITAL_COMMUNITY)
Admission: RE | Disposition: A | Discharge status: Home Health | Payer: Self-pay | Source: Ambulatory Visit | Attending: Orthopedic Surgery

## 2017-05-04 ENCOUNTER — Inpatient Hospital Stay (HOSPITAL_COMMUNITY)
Admission: RE | Admit: 2017-05-04 | Discharge: 2017-05-06 | DRG: 470 | Disposition: A | Discharge status: Home Health | Payer: Managed Care, Other (non HMO) | Source: Ambulatory Visit | Attending: Orthopedic Surgery | Admitting: Orthopedic Surgery

## 2017-05-04 ENCOUNTER — Telehealth: Payer: Self-pay | Admitting: *Deleted

## 2017-05-04 DIAGNOSIS — Z79899 Other long term (current) drug therapy: Secondary | ICD-10-CM

## 2017-05-04 DIAGNOSIS — F17210 Nicotine dependence, cigarettes, uncomplicated: Secondary | ICD-10-CM | POA: Diagnosis present

## 2017-05-04 DIAGNOSIS — D62 Acute posthemorrhagic anemia: Secondary | ICD-10-CM | POA: Diagnosis not present

## 2017-05-04 DIAGNOSIS — E785 Hyperlipidemia, unspecified: Secondary | ICD-10-CM | POA: Diagnosis present

## 2017-05-04 DIAGNOSIS — M25552 Pain in left hip: Secondary | ICD-10-CM | POA: Diagnosis present

## 2017-05-04 DIAGNOSIS — I10 Essential (primary) hypertension: Secondary | ICD-10-CM | POA: Diagnosis present

## 2017-05-04 DIAGNOSIS — F410 Panic disorder [episodic paroxysmal anxiety] without agoraphobia: Secondary | ICD-10-CM | POA: Diagnosis present

## 2017-05-04 DIAGNOSIS — K219 Gastro-esophageal reflux disease without esophagitis: Secondary | ICD-10-CM | POA: Diagnosis present

## 2017-05-04 DIAGNOSIS — M1612 Unilateral primary osteoarthritis, left hip: Principal | ICD-10-CM | POA: Diagnosis present

## 2017-05-04 DIAGNOSIS — Z419 Encounter for procedure for purposes other than remedying health state, unspecified: Secondary | ICD-10-CM

## 2017-05-04 DIAGNOSIS — R945 Abnormal results of liver function studies: Secondary | ICD-10-CM

## 2017-05-04 DIAGNOSIS — R7989 Other specified abnormal findings of blood chemistry: Secondary | ICD-10-CM

## 2017-05-04 DIAGNOSIS — I252 Old myocardial infarction: Secondary | ICD-10-CM | POA: Diagnosis not present

## 2017-05-04 HISTORY — PX: TOTAL HIP ARTHROPLASTY: SHX124

## 2017-05-04 LAB — TYPE AND SCREEN
ABO/RH(D): A POS
ANTIBODY SCREEN: NEGATIVE

## 2017-05-04 LAB — ABO/RH: ABO/RH(D): A POS

## 2017-05-04 SURGERY — ARTHROPLASTY, HIP, TOTAL, ANTERIOR APPROACH
Anesthesia: General | Site: Hip | Laterality: Left

## 2017-05-04 MED ORDER — PHENYLEPHRINE HCL 10 MG/ML IJ SOLN
INTRAMUSCULAR | Status: DC | PRN
  Administered 2017-05-04: 20 ug/min via INTRAVENOUS

## 2017-05-04 MED ORDER — PHENYLEPHRINE 40 MCG/ML (10ML) SYRINGE FOR IV PUSH (FOR BLOOD PRESSURE SUPPORT)
PREFILLED_SYRINGE | INTRAVENOUS | Status: AC
  Filled 2017-05-04: qty 10

## 2017-05-04 MED ORDER — METOCLOPRAMIDE HCL 5 MG/ML IJ SOLN: 5.0000 mg | Freq: Three times a day (TID) | INTRAMUSCULAR | Status: DC | PRN

## 2017-05-04 MED ORDER — MEPERIDINE HCL 25 MG/ML IJ SOLN: 6.2500 mg | INTRAMUSCULAR | Status: DC | PRN

## 2017-05-04 MED ORDER — EPHEDRINE SULFATE 50 MG/ML IJ SOLN
INTRAMUSCULAR | Status: DC | PRN
  Administered 2017-05-04: 10 mg via INTRAVENOUS
  Administered 2017-05-04: 15 mg via INTRAVENOUS

## 2017-05-04 MED ORDER — BUPIVACAINE-EPINEPHRINE (PF) 0.5% -1:200000 IJ SOLN
INTRAMUSCULAR | Status: AC
  Filled 2017-05-04: qty 60

## 2017-05-04 MED ORDER — EPHEDRINE 5 MG/ML INJ
INTRAVENOUS | Status: AC
  Filled 2017-05-04: qty 10

## 2017-05-04 MED ORDER — PANTOPRAZOLE SODIUM 40 MG PO TBEC
40.0000 mg | DELAYED_RELEASE_TABLET | Freq: Every day | ORAL | Status: DC
  Administered 2017-05-05 – 2017-05-06 (×2): 40 mg via ORAL
  Filled 2017-05-04 (×2): qty 1

## 2017-05-04 MED ORDER — AMLODIPINE BESYLATE 5 MG PO TABS
5.0000 mg | ORAL_TABLET | Freq: Every day | ORAL | Status: DC
Start: 2017-05-05 — End: 2017-05-06
  Administered 2017-05-05 – 2017-05-06 (×2): 5 mg via ORAL
  Filled 2017-05-04 (×2): qty 1

## 2017-05-04 MED ORDER — MIDAZOLAM HCL 2 MG/2ML IJ SOLN
INTRAMUSCULAR | Status: AC
  Filled 2017-05-04: qty 2

## 2017-05-04 MED ORDER — PHENOL 1.4 % MT LIQD: 1.0000 | OROMUCOSAL | Status: DC | PRN

## 2017-05-04 MED ORDER — ONDANSETRON HCL 4 MG/2ML IJ SOLN
INTRAMUSCULAR | Status: AC
  Filled 2017-05-04: qty 2

## 2017-05-04 MED ORDER — GABAPENTIN 300 MG PO CAPS
300.0000 mg | ORAL_CAPSULE | Freq: Three times a day (TID) | ORAL | Status: DC
  Administered 2017-05-04 – 2017-05-06 (×6): 300 mg via ORAL
  Filled 2017-05-04: qty 3
  Filled 2017-05-04 (×5): qty 1

## 2017-05-04 MED ORDER — ASPIRIN EC 325 MG PO TBEC: 325.0000 mg | DELAYED_RELEASE_TABLET | Freq: Two times a day (BID) | ORAL | 0 refills | Status: DC

## 2017-05-04 MED ORDER — METHOCARBAMOL 500 MG PO TABS
500.0000 mg | ORAL_TABLET | Freq: Four times a day (QID) | ORAL | Status: DC | PRN
  Administered 2017-05-04 – 2017-05-05 (×3): 500 mg via ORAL
  Filled 2017-05-04 (×3): qty 1

## 2017-05-04 MED ORDER — 0.9 % SODIUM CHLORIDE (POUR BTL) OPTIME
TOPICAL | Status: DC | PRN
Start: 2017-05-04 — End: 2017-05-04
  Administered 2017-05-04: 1000 mL

## 2017-05-04 MED ORDER — PHENYLEPHRINE HCL 10 MG/ML IJ SOLN
INTRAMUSCULAR | Status: DC | PRN
  Administered 2017-05-04 (×3): 80 ug via INTRAVENOUS
  Administered 2017-05-04: 120 ug via INTRAVENOUS
  Administered 2017-05-04: 40 ug via INTRAVENOUS

## 2017-05-04 MED ORDER — GLYCOPYRROLATE 0.2 MG/ML IJ SOLN
INTRAMUSCULAR | Status: DC | PRN
  Administered 2017-05-04: 0.2 mg via INTRAVENOUS

## 2017-05-04 MED ORDER — LIDOCAINE 2% (20 MG/ML) 5 ML SYRINGE
INTRAMUSCULAR | Status: AC
  Filled 2017-05-04: qty 5

## 2017-05-04 MED ORDER — TRANEXAMIC ACID 1000 MG/10ML IV SOLN
1000.0000 mg | Freq: Once | INTRAVENOUS | Status: AC
  Administered 2017-05-04: 1000 mg via INTRAVENOUS
  Filled 2017-05-04: qty 10

## 2017-05-04 MED ORDER — HYDROMORPHONE HCL 1 MG/ML IJ SOLN
0.5000 mg | INTRAMUSCULAR | Status: DC | PRN
  Administered 2017-05-04: 0.5 mg via INTRAVENOUS
  Filled 2017-05-04 (×2): qty 1

## 2017-05-04 MED ORDER — DEXAMETHASONE SODIUM PHOSPHATE 10 MG/ML IJ SOLN
INTRAMUSCULAR | Status: AC
  Filled 2017-05-04: qty 1

## 2017-05-04 MED ORDER — OXYCODONE-ACETAMINOPHEN 5-325 MG PO TABS: 1.0000 | ORAL_TABLET | ORAL | 0 refills | Status: DC | PRN

## 2017-05-04 MED ORDER — ACETAMINOPHEN 325 MG PO TABS
650.0000 mg | ORAL_TABLET | Freq: Four times a day (QID) | ORAL | Status: DC | PRN
  Administered 2017-05-04: 650 mg via ORAL
  Filled 2017-05-04: qty 2

## 2017-05-04 MED ORDER — KCL IN DEXTROSE-NACL 20-5-0.45 MEQ/L-%-% IV SOLN
INTRAVENOUS | Status: DC
  Administered 2017-05-04: 23:00:00 via INTRAVENOUS
  Filled 2017-05-04 (×2): qty 1000

## 2017-05-04 MED ORDER — DEXAMETHASONE SODIUM PHOSPHATE 10 MG/ML IJ SOLN
INTRAMUSCULAR | Status: DC | PRN
  Administered 2017-05-04: 10 mg via INTRAVENOUS

## 2017-05-04 MED ORDER — ACETAMINOPHEN 650 MG RE SUPP: 650.0000 mg | Freq: Four times a day (QID) | RECTAL | Status: DC | PRN

## 2017-05-04 MED ORDER — CELECOXIB 200 MG PO CAPS
200.0000 mg | ORAL_CAPSULE | Freq: Two times a day (BID) | ORAL | Status: DC
  Administered 2017-05-04 – 2017-05-06 (×4): 200 mg via ORAL
  Filled 2017-05-04 (×4): qty 1

## 2017-05-04 MED ORDER — LORATADINE 10 MG PO TABS
10.0000 mg | ORAL_TABLET | Freq: Every day | ORAL | Status: DC
  Administered 2017-05-05 – 2017-05-06 (×2): 10 mg via ORAL
  Filled 2017-05-04 (×2): qty 1

## 2017-05-04 MED ORDER — FENTANYL CITRATE (PF) 250 MCG/5ML IJ SOLN
INTRAMUSCULAR | Status: AC
  Filled 2017-05-04: qty 5

## 2017-05-04 MED ORDER — ROCURONIUM BROMIDE 50 MG/5ML IV SOLN
INTRAVENOUS | Status: AC
  Filled 2017-05-04: qty 1

## 2017-05-04 MED ORDER — BUPIVACAINE-EPINEPHRINE (PF) 0.5% -1:200000 IJ SOLN
INTRAMUSCULAR | Status: DC | PRN
  Administered 2017-05-04: 50 mL

## 2017-05-04 MED ORDER — PROPOFOL 500 MG/50ML IV EMUL
INTRAVENOUS | Status: DC | PRN
  Administered 2017-05-04: 75 ug/kg/min via INTRAVENOUS

## 2017-05-04 MED ORDER — DEXAMETHASONE SODIUM PHOSPHATE 10 MG/ML IJ SOLN
10.0000 mg | Freq: Once | INTRAMUSCULAR | Status: AC
  Administered 2017-05-05: 10 mg via INTRAVENOUS
  Filled 2017-05-04: qty 1

## 2017-05-04 MED ORDER — ONDANSETRON HCL 4 MG PO TABS: 4.0000 mg | ORAL_TABLET | Freq: Four times a day (QID) | ORAL | Status: DC | PRN

## 2017-05-04 MED ORDER — MIDAZOLAM HCL 5 MG/5ML IJ SOLN
INTRAMUSCULAR | Status: DC | PRN
  Administered 2017-05-04: 2 mg via INTRAVENOUS

## 2017-05-04 MED ORDER — BISACODYL 5 MG PO TBEC: 5.0000 mg | DELAYED_RELEASE_TABLET | Freq: Every day | ORAL | Status: DC | PRN

## 2017-05-04 MED ORDER — POLYETHYLENE GLYCOL 3350 17 G PO PACK: 17.0000 g | PACK | Freq: Every day | ORAL | Status: DC | PRN

## 2017-05-04 MED ORDER — OXYCODONE HCL 5 MG PO TABS
5.0000 mg | ORAL_TABLET | ORAL | Status: DC | PRN
  Administered 2017-05-04 – 2017-05-05 (×3): 10 mg via ORAL
  Administered 2017-05-06: 5 mg via ORAL
  Filled 2017-05-04 (×5): qty 2

## 2017-05-04 MED ORDER — MENTHOL 3 MG MT LOZG
1.0000 | LOZENGE | OROMUCOSAL | Status: DC | PRN
Start: 2017-05-04 — End: 2017-05-06

## 2017-05-04 MED ORDER — PROPOFOL 10 MG/ML IV BOLUS
INTRAVENOUS | Status: DC | PRN
  Administered 2017-05-04: 140 mg via INTRAVENOUS
  Administered 2017-05-04: 30 mg via INTRAVENOUS

## 2017-05-04 MED ORDER — CARVEDILOL 6.25 MG PO TABS
6.2500 mg | ORAL_TABLET | Freq: Two times a day (BID) | ORAL | Status: DC
  Administered 2017-05-04 – 2017-05-06 (×4): 6.25 mg via ORAL
  Filled 2017-05-04 (×4): qty 1

## 2017-05-04 MED ORDER — PROPOFOL 10 MG/ML IV BOLUS
INTRAVENOUS | Status: AC
  Filled 2017-05-04: qty 20

## 2017-05-04 MED ORDER — FENTANYL CITRATE (PF) 100 MCG/2ML IJ SOLN
INTRAMUSCULAR | Status: DC | PRN
  Administered 2017-05-04 (×3): 50 ug via INTRAVENOUS

## 2017-05-04 MED ORDER — ALUMINUM HYDROXIDE GEL 320 MG/5ML PO SUSP
15.0000 mL | ORAL | Status: DC | PRN
  Administered 2017-05-05: 30 mL via ORAL
  Filled 2017-05-04 (×3): qty 30

## 2017-05-04 MED ORDER — TIZANIDINE HCL 2 MG PO TABS: 2.0000 mg | ORAL_TABLET | Freq: Four times a day (QID) | ORAL | 0 refills | Status: DC | PRN

## 2017-05-04 MED ORDER — FENTANYL CITRATE (PF) 100 MCG/2ML IJ SOLN
25.0000 ug | INTRAMUSCULAR | Status: DC | PRN
  Administered 2017-05-04 (×4): 25 ug via INTRAVENOUS

## 2017-05-04 MED ORDER — LACTATED RINGERS IV SOLN
INTRAVENOUS | Status: DC | PRN
  Administered 2017-05-04 (×2): via INTRAVENOUS

## 2017-05-04 MED ORDER — LIDOCAINE HCL (CARDIAC) 20 MG/ML IV SOLN
INTRAVENOUS | Status: DC | PRN
  Administered 2017-05-04: 100 mg via INTRAVENOUS
  Administered 2017-05-04: 10 mg via INTRAVENOUS

## 2017-05-04 MED ORDER — METOCLOPRAMIDE HCL 5 MG PO TABS: 5.0000 mg | ORAL_TABLET | Freq: Three times a day (TID) | ORAL | Status: DC | PRN

## 2017-05-04 MED ORDER — LACTATED RINGERS IV SOLN: INTRAVENOUS | Status: DC

## 2017-05-04 MED ORDER — ONDANSETRON HCL 4 MG/2ML IJ SOLN
4.0000 mg | Freq: Four times a day (QID) | INTRAMUSCULAR | Status: DC | PRN
  Administered 2017-05-04: 4 mg via INTRAVENOUS
  Filled 2017-05-04 (×2): qty 2

## 2017-05-04 MED ORDER — DIPHENHYDRAMINE HCL 12.5 MG/5ML PO ELIX: 12.5000 mg | ORAL_SOLUTION | ORAL | Status: DC | PRN

## 2017-05-04 MED ORDER — FENTANYL CITRATE (PF) 100 MCG/2ML IJ SOLN
INTRAMUSCULAR | Status: AC
  Filled 2017-05-04: qty 2

## 2017-05-04 MED ORDER — FLEET ENEMA 7-19 GM/118ML RE ENEM: 1.0000 | ENEMA | Freq: Once | RECTAL | Status: DC | PRN

## 2017-05-04 MED ORDER — ASPIRIN EC 325 MG PO TBEC
325.0000 mg | DELAYED_RELEASE_TABLET | Freq: Every day | ORAL | Status: DC
  Administered 2017-05-05 – 2017-05-06 (×2): 325 mg via ORAL
  Filled 2017-05-04 (×2): qty 1

## 2017-05-04 MED ORDER — METOCLOPRAMIDE HCL 5 MG/ML IJ SOLN: 10.0000 mg | Freq: Once | INTRAMUSCULAR | Status: DC | PRN

## 2017-05-04 MED ORDER — PRAVASTATIN SODIUM 40 MG PO TABS
40.0000 mg | ORAL_TABLET | Freq: Every day | ORAL | Status: DC
  Administered 2017-05-05: 40 mg via ORAL
  Filled 2017-05-04: qty 1

## 2017-05-04 MED ORDER — CHLORHEXIDINE GLUCONATE 4 % EX LIQD: 60.0000 mL | Freq: Once | CUTANEOUS | Status: DC

## 2017-05-04 MED ORDER — ONDANSETRON HCL 4 MG/2ML IJ SOLN
INTRAMUSCULAR | Status: DC | PRN
  Administered 2017-05-04: 4 mg via INTRAVENOUS

## 2017-05-04 MED ORDER — METHOCARBAMOL 1000 MG/10ML IJ SOLN
500.0000 mg | Freq: Four times a day (QID) | INTRAVENOUS | Status: DC | PRN
  Filled 2017-05-04: qty 5

## 2017-05-04 MED ORDER — DOCUSATE SODIUM 100 MG PO CAPS
100.0000 mg | ORAL_CAPSULE | Freq: Two times a day (BID) | ORAL | Status: DC
  Administered 2017-05-04 – 2017-05-06 (×4): 100 mg via ORAL
  Filled 2017-05-04 (×4): qty 1

## 2017-05-04 SURGICAL SUPPLY — 42 items
BAG DECANTER FOR FLEXI CONT (MISCELLANEOUS) ×2 IMPLANT
BLADE SAW SGTL 18X1.27X75 (BLADE) ×2 IMPLANT
CAPT HIP TOTAL 2 ×2 IMPLANT
COVER PERINEAL POST (MISCELLANEOUS) ×2 IMPLANT
COVER SURGICAL LIGHT HANDLE (MISCELLANEOUS) ×2 IMPLANT
DRAPE C-ARM 42X72 X-RAY (DRAPES) ×2 IMPLANT
DRAPE STERI IOBAN 125X83 (DRAPES) ×2 IMPLANT
DRAPE U-SHAPE 47X51 STRL (DRAPES) ×4 IMPLANT
DRSG AQUACEL AG ADV 3.5X10 (GAUZE/BANDAGES/DRESSINGS) ×2 IMPLANT
DURAPREP 26ML APPLICATOR (WOUND CARE) ×2 IMPLANT
ELECT BLADE 4.0 EZ CLEAN MEGAD (MISCELLANEOUS) ×2
ELECT REM PT RETURN 9FT ADLT (ELECTROSURGICAL) ×2
ELECTRODE BLDE 4.0 EZ CLN MEGD (MISCELLANEOUS) ×1 IMPLANT
ELECTRODE REM PT RTRN 9FT ADLT (ELECTROSURGICAL) ×1 IMPLANT
FACESHIELD WRAPAROUND (MASK) ×4 IMPLANT
GLOVE BIO SURGEON STRL SZ7.5 (GLOVE) ×2 IMPLANT
GLOVE BIO SURGEON STRL SZ8.5 (GLOVE) ×2 IMPLANT
GLOVE BIOGEL PI IND STRL 8 (GLOVE) ×1 IMPLANT
GLOVE BIOGEL PI IND STRL 9 (GLOVE) ×1 IMPLANT
GLOVE BIOGEL PI INDICATOR 8 (GLOVE) ×1
GLOVE BIOGEL PI INDICATOR 9 (GLOVE) ×1
GOWN STRL REUS W/ TWL LRG LVL3 (GOWN DISPOSABLE) ×1 IMPLANT
GOWN STRL REUS W/ TWL XL LVL3 (GOWN DISPOSABLE) ×2 IMPLANT
GOWN STRL REUS W/TWL LRG LVL3 (GOWN DISPOSABLE) ×1
GOWN STRL REUS W/TWL XL LVL3 (GOWN DISPOSABLE) ×2
KIT BASIN OR (CUSTOM PROCEDURE TRAY) ×2 IMPLANT
KIT ROOM TURNOVER OR (KITS) ×2 IMPLANT
MANIFOLD NEPTUNE II (INSTRUMENTS) ×2 IMPLANT
NEEDLE HYPO 22GX1.5 SAFETY (NEEDLE) ×4 IMPLANT
NS IRRIG 1000ML POUR BTL (IV SOLUTION) ×2 IMPLANT
PACK TOTAL JOINT (CUSTOM PROCEDURE TRAY) ×2 IMPLANT
PAD ARMBOARD 7.5X6 YLW CONV (MISCELLANEOUS) ×4 IMPLANT
SUT VIC AB 1 CTX 36 (SUTURE) ×1
SUT VIC AB 1 CTX36XBRD ANBCTR (SUTURE) ×1 IMPLANT
SUT VIC AB 2-0 CT1 27 (SUTURE) ×1
SUT VIC AB 2-0 CT1 TAPERPNT 27 (SUTURE) ×1 IMPLANT
SUT VIC AB 3-0 CT1 27 (SUTURE) ×1
SUT VIC AB 3-0 CT1 TAPERPNT 27 (SUTURE) ×1 IMPLANT
SYR CONTROL 10ML LL (SYRINGE) ×4 IMPLANT
TOWEL OR 17X24 6PK STRL BLUE (TOWEL DISPOSABLE) ×2 IMPLANT
TOWEL OR 17X26 10 PK STRL BLUE (TOWEL DISPOSABLE) ×2 IMPLANT
TRAY CATH 16FR W/PLASTIC CATH (SET/KITS/TRAYS/PACK) ×2 IMPLANT

## 2017-05-04 NOTE — Anesthesia Preprocedure Evaluation (Addendum)
Anesthesia Evaluation  Patient identified by MRN, date of birth, ID band Patient awake    Reviewed: Allergy & Precautions, NPO status , Patient's Chart, lab work & pertinent test results, reviewed documented beta blocker date and time   Airway Mallampati: II  TM Distance: >3 FB Neck ROM: Full    Dental no notable dental hx. (+) Poor Dentition, Dental Advisory Given   Pulmonary Current Smoker,    Pulmonary exam normal breath sounds clear to auscultation       Cardiovascular hypertension, Pt. on medications and Pt. on home beta blockers + angina + Past MI (2008)  Normal cardiovascular exam Rhythm:Regular Rate:Normal  Cardiac cath 2011.   Neuro/Psych negative neurological ROS  negative psych ROS   GI/Hepatic negative GI ROS, GERD  ,(+)     substance abuse (clean x 5 years)  , Hepatitis -, C  Endo/Other  negative endocrine ROS  Renal/GU negative Renal ROS  negative genitourinary   Musculoskeletal negative musculoskeletal ROS (+)   Abdominal   Peds negative pediatric ROS (+)  Hematology negative hematology ROS (+)   Anesthesia Other Findings   Reproductive/Obstetrics negative OB ROS                          Anesthesia Physical Anesthesia Plan  ASA: III  Anesthesia Plan: Spinal   Post-op Pain Management:    Induction:   PONV Risk Score and Plan: Treatment may vary due to age or medical condition  Airway Management Planned: Simple Face Mask  Additional Equipment:   Intra-op Plan:   Post-operative Plan:   Informed Consent: I have reviewed the patients History and Physical, chart, labs and discussed the procedure including the risks, benefits and alternatives for the proposed anesthesia with the patient or authorized representative who has indicated his/her understanding and acceptance.   Dental advisory given  Plan Discussed with: CRNA  Anesthesia Plan Comments:          Anesthesia Quick Evaluation

## 2017-05-04 NOTE — Transfer of Care (Signed)
Immediate Anesthesia Transfer of Care Note  Patient: Bryan Levy  Procedure(s) Performed: LEFT TOTAL HIP ARTHROPLASTY ANTERIOR APPROACH (Left Hip)  Patient Location: PACU  Anesthesia Type:General and Spinal  Level of Consciousness: awake, oriented and patient cooperative  Airway & Oxygen Therapy: Patient Spontanous Breathing and Patient connected to face mask oxygen  Post-op Assessment: Report given to RN and Post -op Vital signs reviewed and stable  Post vital signs: Reviewed  Last Vitals:  Vitals:   05/04/17 0607  BP: (!) 173/96  Pulse: 61  Resp: 18  Temp: 36.8 C  SpO2: 100%    Last Pain:  Vitals:   05/04/17 0610  PainSc: 6          Complications: No apparent anesthesia complications

## 2017-05-04 NOTE — Progress Notes (Signed)
Spoke with Duwayne Heck PA, aware of above.  Reinforce dressing if saturated.  Surgeon will address & assess and change dressing tomorrow am. [id number]

## 2017-05-04 NOTE — Anesthesia Procedure Notes (Addendum)
Procedure Name: LMA Insertion Date/Time: 05/04/2017 8:07 AM Performed by: Romie Minus K Pre-anesthesia Checklist: Patient identified, Emergency Drugs available, Suction available and Patient being monitored Patient Re-evaluated:Patient Re-evaluated prior to induction Oxygen Delivery Method: Circle System Utilized Preoxygenation: Pre-oxygenation with 100% oxygen Induction Type: IV induction LMA: LMA inserted LMA Size: 4.0 Number of attempts: 1 Placement Confirmation: positive ETCO2 and breath sounds checked- equal and bilateral Tube secured with: Tape Dental Injury: Teeth and Oropharynx as per pre-operative assessment

## 2017-05-04 NOTE — Evaluation (Signed)
Physical Therapy Evaluation Patient Details Name: Bryan Levy MRN: [id number] DOB: Feb 03, 1951 Today's Date: 05/04/2017   History of Present Illness  Pt is a 66 y/o male s/p elective L THA, anterior hip precautions. PMH includes polysubstance abuse, L hip and ankle fracture s/p ORIF, MI.   Clinical Impression  Pt is s/p surgery above with deficits below. PTA, pt was independent with mobility using cane. Upon eval, pt limited by post op pain and swelling. Very limited tolerance for there ex. Notified RN about increased swelling at L hip and RN came to room and reported there was increased swelling. RN to speak with MD. Further mobility held until cleared with MD. Follow up recommendations per MD arrangements. Reports wife will be able to assist as necessary upon d/c home. Will continue to follow acutely to maximize functional mobility independence and safety.     Follow Up Recommendations DC plan and follow up therapy as arranged by surgeon;Supervision for mobility/OOB    Equipment Recommendations  None recommended by PT    Recommendations for Other Services       Precautions / Restrictions Precautions Precautions: Anterior Hip Precaution Booklet Issued: Yes (comment) Precaution Comments: Reviewed anterior hip precautions with pt and limited tolerance for ther ex secondary to pain.  Restrictions Weight Bearing Restrictions: Yes LLE Weight Bearing: Weight bearing as tolerated      Mobility  Bed Mobility               General bed mobility comments: Deferred secondary to increased swelling and drainage and slight indention in LLE.   Transfers                 General transfer comment: Deferred  Ambulation/Gait             General Gait Details: Deferred  Stairs            Wheelchair Mobility    Modified Rankin (Stroke Patients Only)       Balance                                             Pertinent Vitals/Pain Pain  Assessment: 0-10 Pain Score: 8  Pain Location: L hip  Pain Descriptors / Indicators: Aching;Constant;Operative site guarding Pain Intervention(s): Limited activity within patient's tolerance;Monitored during session;Repositioned    Home Living Family/patient expects to be discharged to:: Private residence Living Arrangements: Spouse/significant other Available Help at Discharge: Family;Available 24 hours/day Type of Home: House Home Access: Stairs to enter Entrance Stairs-Rails: Right Entrance Stairs-Number of Steps: 3 Home Layout: One level Home Equipment: Shower seat;Cane - single point;Walker - 2 wheels;Bedside commode      Prior Function Level of Independence: Independent with assistive device(s)         Comments: Using cane for ambulation      Hand Dominance   Dominant Hand: Right    Extremity/Trunk Assessment   Upper Extremity Assessment Upper Extremity Assessment: Overall WFL for tasks assessed    Lower Extremity Assessment Lower Extremity Assessment: LLE deficits/detail LLE Deficits / Details: Increased swelling noted at incision site; limited tolerance to LLE ther ex. Notified RN and RN to contact MD. Functional mobility deferred this session.        Communication   Communication: No difficulties  Cognition Arousal/Alertness: Awake/alert Behavior During Therapy: WFL for tasks assessed/performed Overall Cognitive Status: Within Functional Limits for tasks assessed  General Comments General comments (skin integrity, edema, etc.): Pt concerned about new swelling at incision site; limited tolerance for ther ex. Notified RN and RN to speak with MD. Held mobility until cleared by MD. Put ice on site and RN present in the room.     Exercises Total Joint Exercises Ankle Circles/Pumps: AROM;Both;20 reps Quad Sets: AROM;Left;10 reps Heel Slides: AAROM;Left;10 reps (partial range )   Assessment/Plan     PT Assessment Patient needs continued PT services  PT Problem List Decreased strength;Decreased range of motion;Decreased activity tolerance;Decreased balance;Decreased mobility;Decreased knowledge of use of DME;Decreased knowledge of precautions;Pain       PT Treatment Interventions DME instruction;Gait training;Stair training;Functional mobility training;Therapeutic activities;Neuromuscular re-education;Balance training;Therapeutic exercise;Patient/family education    PT Goals (Current goals can be found in the Care Plan section)  Acute Rehab PT Goals Patient Stated Goal: to decrease pain  PT Goal Formulation: With patient Time For Goal Achievement: 05/11/17 Potential to Achieve Goals: Good    Frequency 7X/week   Barriers to discharge        Co-evaluation               AM-PAC PT "6 Clicks" Daily Activity  Outcome Measure Difficulty turning over in bed (including adjusting bedclothes, sheets and blankets)?: A Little Difficulty moving from lying on back to sitting on the side of the bed? : Unable Difficulty sitting down on and standing up from a chair with arms (e.g., wheelchair, bedside commode, etc,.)?: Unable Help needed moving to and from a bed to chair (including a wheelchair)?: A Lot Help needed walking in hospital room?: A Lot Help needed climbing 3-5 steps with a railing? : Total 6 Click Score: 10    End of Session   Activity Tolerance: Treatment limited secondary to medical complications (Comment) (increased swelling at incision site ) Patient left: in bed;with call bell/phone within reach;with nursing/sitter in room Nurse Communication: Mobility status (increased swelling of incision site ) PT Visit Diagnosis: Other abnormalities of gait and mobility (R26.89);Pain Pain - Right/Left: Left Pain - part of body: Hip    Time: 1610-9604 PT Time Calculation (min) (ACUTE ONLY): 25 min   Charges:   PT Evaluation $PT Eval Moderate Complexity: 1 Mod PT  Treatments $Therapeutic Exercise: 8-22 mins   PT G Codes:        Gladys Damme, PT, DPT  Acute Rehabilitation Services  Pager: [phone]   Lehman Prom 05/04/2017, 5:13 PM

## 2017-05-04 NOTE — Anesthesia Procedure Notes (Signed)
Procedure Name: MAC Date/Time: 05/04/2017 8:50 AM Performed by: Jenne Campus Pre-anesthesia Checklist: Patient identified, Emergency Drugs available, Suction available, Patient being monitored and Timeout performed Oxygen Delivery Method: Simple face mask

## 2017-05-04 NOTE — Anesthesia Postprocedure Evaluation (Signed)
Anesthesia Post Note  Patient: Bryan Levy  Procedure(s) Performed: LEFT TOTAL HIP ARTHROPLASTY ANTERIOR APPROACH (Left Hip)     Patient location during evaluation: PACU Anesthesia Type: Spinal Level of consciousness: awake and alert Pain management: pain level controlled Vital Signs Assessment: post-procedure vital signs reviewed and stable Respiratory status: spontaneous breathing and respiratory function stable Cardiovascular status: blood pressure returned to baseline and stable Postop Assessment: no headache, no backache, spinal receding and no apparent nausea or vomiting Anesthetic complications: no    Last Vitals:  Vitals:   05/04/17 1149 05/04/17 1204  BP:  135/85  Pulse:  68  Resp:    Temp: 36.5 C 36.5 C  SpO2:  94%    Last Pain:  Vitals:   05/04/17 1204  TempSrc: Oral  PainSc:                  Phillips Grout

## 2017-05-04 NOTE — Discharge Instructions (Signed)

## 2017-05-04 NOTE — Interval H&P Note (Signed)
History and Physical Interval Note:  05/04/2017 7:07 AM  Bryan Levy  has presented today for surgery, with the diagnosis of LEFT HIP OSTEOARTHRITIS  The various methods of treatment have been discussed with the patient and family. After consideration of risks, benefits and other options for treatment, the patient has consented to  Procedure(s): LEFT TOTAL HIP ARTHROPLASTY ANTERIOR APPROACH (Left) as a surgical intervention .  The patient's history has been reviewed, patient examined, no change in status, stable for surgery.  I have reviewed the patient's chart and labs.  Questions were answered to the patient's satisfaction.     Nestor Lewandowsky

## 2017-05-04 NOTE — Op Note (Signed)
OPERATIVE REPORT    DATE OF PROCEDURE:  05/04/2017       PREOPERATIVE DIAGNOSIS:  LEFT HIP OSTEOARTHRITIS                                                          POSTOPERATIVE DIAGNOSIS:  LEFT HIP OSTEOARTHRITIS                                                           PROCEDURE: Anterior L total hip arthroplasty using a 50 mm DePuy Pinnacle  Cup, [employer], 0-degree polyethylene liner, a +5 32 mm ceramic head, a 5 hi Depuy Triloc stem   SURGEON: ZJIRC,VELFY J    ASSISTANT:   Eric K. [employer]  (present throughout entire procedure and necessary for timely completion of the procedure)   ANESTHESIA: Spinal BLOOD LOSS: 300 FLUID REPLACEMENT: 1500 crystalloid Antibiotic: 2gm ancef Tranexamic Acid: 1gm IV, 2gm Topical COMPLICATIONS: none    INDICATIONS FOR PROCEDURE: A 66 y.o. year-old With  LEFT HIP OSTEOARTHRITIS   for 2 years, x-rays show bone-on-bone arthritic changes, and osteophytes. Despite conservative measures with observation, anti-inflammatory medicine, narcotics, use of a cane, has severe unremitting pain and can ambulate only a few blocks before resting. Patient desires elective L total hip arthroplasty to decrease pain and increase function. The risks, benefits, and alternatives were discussed at length including but not limited to the risks of infection, bleeding, nerve injury, stiffness, blood clots, the need for revision surgery, cardiopulmonary complications, among others, and they were willing to proceed. Questions answered     PROCEDURE IN DETAIL: The patient was identified by armband,  received preoperative IV antibiotics in the holding area at [facility], taken to the operating room , appropriate anesthetic monitors  were attached and  anesthesia was induced with the patienton the gurney. The HANA boots were applied to the feet and he was then transferred to the HANA table with a peroneal post and support underneath the non-operative le,  which was locked in 5 lb traction. Theoperative lower extremity was then prepped and draped in the usual sterile fashion from just above the iliac crest to the knee. And a timeout procedure was performed. We then made a 12 cm incision along the interval at the leading edge of the tensor fascia lata of starting at 2 cm lateral to and 2 cm distal to the ASIS. Small bleeders in the skin and subcutaneous tissue identified and cauterized we dissected down to the fascia and made an incision in the fascia allowing [place] to elevate the fascia of the tensor muscle and exploited the interval between the rectus and the tensor fascia lata. A Hohmann retractor was then placed along the superior neck of the femur and a Cobra retractor along the inferior neck of the femur we teed the capsule starting out at the superior anterior aspect of the acetabulum going distally and made the T along the neck both leaflets of the T were tagged with #2 Ethibond suture. Cobra retractors were then placed along the inferior and superior neck allowing [place] to perform a standard neck cut and  removed the femoral head with a power corkscrew. We then placed a right angle Hohmann retractor along the anterior aspect of the acetabulum a spiked Cobra in the cotyloid notch and posteriorly a Muelller retractor. We then sequentially reamed up to a 49 mm basket reamer obtaining good coverage in all quadrants, verified by C-arm imaging. Under C-arm control with and hammered into place a 50 mm Pinnacle cup in 45 of abduction and 15 of anteversion. The cup seated nicely and required no supplemental screws. We then placed a central hole Eliminator and a 0 polyethylene liner. The foot was then externally rotated to 110, the HANA elevator was placed around the flare of the greater trochanter and the limb was extended and abducted delivering the proximal femur up into the wound. A medium Hohmann retractor was placed over the greater trochanter and a Mueller retractor  along the posterior femoral neck completing the exposure. We then performed releases superiorly and and inferiorly of the capsule going back to the pirformis fossa superiorly and to the lesser trochanter inferiorly. We then entered the proximal femur with the box cutting offset chisel followed by, a canal sounder, the chili pepper and broaching up to a 5 hi broach. This seated nicely and we reamed the calcar. A trial reduction was performed with a +5 mm 32 mm head.The limb lengths were excellent the hip was stable in 90 of external rotation. At this point the trial components removed and we hammered into place a # 5 hi  Offset Tri-Lock stem with Gryption coating. A + 5 32 mm ceramic ball was then hammered into place the hip was reduced and final C-arm images obtained. The wound was thoroughly irrigated with normal saline solution. We repaired the ant capsule and the tensor fascia lot a with running 0 vicryl suture. the subcutaneous tissue was closed with 2-0 and 3-0 Vicryl suture followed by an Aquacil dressing. At this point the patient was awaken and transferred to hospital gurney without difficulty. The subcutaneous tissue with 0 and 2-0 undyed Vicryl suture and the skin with running  3-0 vicryl subcuticular suture. Aquacil dressing was applied. The patient was then unclamped, rolled supine, awaken extubated and taken to recovery room without difficulty in stable condition.   Malekai Markwood J 05/04/2017, 9:00 AM

## 2017-05-04 NOTE — Telephone Encounter (Signed)
[facility] results received and placed on [facility] desk

## 2017-05-04 NOTE — Progress Notes (Signed)
Per PT pt c/o pain increased pain/ discomfort at operative site (L Hip), pt couldn't complete therapy session as a result.  This nurse assessed site appears to be more swollen than on admission and drainage noted on dressing at t his time, call placed to primary team.  Spoke with Bryan Levy and advised of above, she will pass message on.  [email or web address]

## 2017-05-05 ENCOUNTER — Encounter (HOSPITAL_COMMUNITY): Payer: Self-pay | Admitting: Orthopedic Surgery

## 2017-05-05 LAB — CBC
HEMATOCRIT: 30.4 % — AB (ref 39.0–52.0)
Hemoglobin: 9.8 g/dL — ABNORMAL LOW (ref 13.0–17.0)
MCH: 27.8 pg (ref 26.0–34.0)
MCHC: 32.2 g/dL (ref 30.0–36.0)
MCV: 86.4 fL (ref 78.0–100.0)
Platelets: 254 10*3/uL (ref 150–400)
RBC: 3.52 MIL/uL — ABNORMAL LOW (ref 4.22–5.81)
RDW: 14.1 % (ref 11.5–15.5)
WBC: 10.9 10*3/uL — AB (ref 4.0–10.5)

## 2017-05-05 LAB — BASIC METABOLIC PANEL
Anion gap: 6 (ref 5–15)
BUN: 12 mg/dL (ref 6–20)
CALCIUM: 8.7 mg/dL — AB (ref 8.9–10.3)
CO2: 27 mmol/L (ref 22–32)
CREATININE: 1.06 mg/dL (ref 0.61–1.24)
Chloride: 102 mmol/L (ref 101–111)
GFR calc non Af Amer: >60 mL/min (ref >60)
Glucose, Bld: 170 mg/dL — ABNORMAL HIGH (ref 65–99)
Potassium: 4.5 mmol/L (ref 3.5–5.1)
SODIUM: 135 mmol/L (ref 135–145)

## 2017-05-05 NOTE — Progress Notes (Signed)
Physical Therapy Treatment Patient Details Name: Bryan Levy MRN: [id number] DOB: 06-01-1951 Today's Date: 05/05/2017    History of Present Illness Pt is a 66 y/o male s/p elective L THA (anterior hip precautions) on 05/04/17. PMH includes polysubstance abuse, L hip and ankle fx s/p ORIF, MI.    PT Comments    Pt progressing well with mobility. Amb 150' with RW and ascend/descended 4 steps with R-rail, requiring supervision for safety. Reviewed therex, and pt able to recall 3/3 anterior hip precautions. From a mobility standpoint, feel pt is safe to return home with HHPT and supervision from wife. Will continue to follow acutely to maximize functional mobility and independence.   Follow Up Recommendations  DC plan and follow up therapy as arranged by surgeon;Supervision for mobility/OOB     Equipment Recommendations  None recommended by PT    Recommendations for Other Services       Precautions / Restrictions Precautions Precautions: Anterior Hip Precaution Comments: Pt able to recall 2/3 precautions at beginning of session; recalled 3/3 at end of session Restrictions Weight Bearing Restrictions: Yes LLE Weight Bearing: Weight bearing as tolerated    Mobility  Bed Mobility Overal bed mobility: Modified Independent Bed Mobility: Supine to Sit;Sit to Supine              Transfers Overall transfer level: Modified independent Equipment used: Rolling walker (2 wheeled) Transfers: Sit to/from Stand Sit to Stand: Modified independent (Device/Increase time)         General transfer comment: Intermittent cues for hand placement and safe positioning with RW  Ambulation/Gait Ambulation/Gait assistance: Supervision Ambulation Distance (Feet): 150 Feet Assistive device: Rolling walker (2 wheeled) Gait Pattern/deviations: Step-through pattern;Decreased stride length;Decreased weight shift to left;Antalgic Gait velocity: Decreased Gait velocity interpretation: <1.8  ft/sec, indicative of risk for recurrent falls General Gait Details: Slow, controlled amb with RW and supervision for safety. Encouraged to increase WBAT on LLE as able in order to decrease reliance on BUEs.    Stairs Stairs: Yes   Stair Management: One rail Right;Sideways Number of Stairs: 4 General stair comments: Ascend/descended steps with single UE support on R-rail; educ on technique. Supervision for safety  Wheelchair Mobility    Modified Rankin (Stroke Patients Only)       Balance Overall balance assessment: Needs assistance Sitting-balance support: No upper extremity supported;Feet supported Sitting balance-Leahy Scale: Good     Standing balance support: Single extremity supported;Bilateral upper extremity supported;During functional activity Standing balance-Leahy Scale: Fair Standing balance comment: Indep with pericare standing at toilet and washing hands at sink with no UE support                            Cognition Arousal/Alertness: Awake/alert Behavior During Therapy: WFL for tasks assessed/performed Overall Cognitive Status: Within Functional Limits for tasks assessed                                        Exercises Total Joint Exercises Ankle Circles/Pumps: AROM;Both;10 reps;Supine Short Arc Quad: AROM;Left;10 reps;Supine Heel Slides: AROM;Left;10 reps;Supine    General Comments        Pertinent Vitals/Pain Pain Assessment: Faces Faces Pain Scale: Hurts little more Pain Location: L hip Pain Descriptors / Indicators: Discomfort;Operative site guarding Pain Intervention(s): Monitored during session;Ice applied    Home Living  Prior Function            PT Goals (current goals can now be found in the care plan section) Acute Rehab PT Goals Patient Stated Goal: to decrease pain  PT Goal Formulation: With patient Time For Goal Achievement: 05/11/17 Potential to Achieve Goals:  Good Progress towards PT goals: Progressing toward goals    Frequency    7X/week      PT Plan Current plan remains appropriate    Co-evaluation              AM-PAC PT "6 Clicks" Daily Activity  Outcome Measure  Difficulty turning over in bed (including adjusting bedclothes, sheets and blankets)?: None Difficulty moving from lying on back to sitting on the side of the bed? : None Difficulty sitting down on and standing up from a chair with arms (e.g., wheelchair, bedside commode, etc,.)?: None Help needed moving to and from a bed to chair (including a wheelchair)?: None Help needed walking in hospital room?: A Little Help needed climbing 3-5 steps with a railing? : A Little 6 Click Score: 22    End of Session Equipment Utilized During Treatment: Gait belt Activity Tolerance: Patient tolerated treatment well Patient left: in bed;with call bell/phone within reach Nurse Communication: Mobility status PT Visit Diagnosis: Other abnormalities of gait and mobility (R26.89);Pain Pain - Right/Left: Left Pain - part of body: Hip     Time: 1610-9604 PT Time Calculation (min) (ACUTE ONLY): 18 min  Charges:  $Gait Training: 8-22 mins                    G Codes:      Ina Homes, PT, DPT Acute Rehab Services  Pager: [phone]  Malachy Chamber 05/05/2017, 3:42 PM

## 2017-05-05 NOTE — Progress Notes (Signed)
PATIENT ID: Bryan Levy  MRN: [id number]  DOB/AGE:  1950/10/16 / 66 y.o.  1 Day Post-Op Procedure(s) (LRB): LEFT TOTAL HIP ARTHROPLASTY ANTERIOR APPROACH (Left)    PROGRESS NOTE Subjective: Patient is alert, oriented, no Nausea, no Vomiting, yes passing gas, . Taking PO well. Denies SOB, Chest or Calf Pain. Using Incentive Spirometer, PAS in place. Ambulate WBAT to BR Patient reports pain as  5/10  .    Objective: Vital signs in last 24 hours: Vitals:   05/04/17 1726 05/04/17 2115 05/05/17 0125 05/05/17 0536  BP: 125/83 109/65 120/61 125/62  Pulse: 67 (!) 51 70 75  Resp:  Temp: (!) 97.5 F (36.4 C) 98.5 F (36.9 C) 98.5 F (36.9 C) 97.8 F (36.6 C)  TempSrc: Axillary Oral Oral Oral  SpO2: 99% 100% 100% 100%      Intake/Output from previous day: I/O last 3 completed shifts: In: 1800 [I.V.:1700; IV Piggyback:100] Out: 1175 [Urine:925; Blood:250]   Intake/Output this shift: No intake/output data recorded.   LABORATORY DATA: No results for input(s): WBC, HGB, HCT, PLT, NA, K, CL, CO2, BUN, CREATININE, GLUCOSE, GLUCAP, INR, CALCIUM in the last 72 hours.  Invalid input(s): PT, 2  Examination: Neurologically intact ABD soft Neurovascular intact Sensation intact distally Intact pulses distally Dorsiflexion/Plantar flexion intact Incision: scant drainage No cellulitis present Compartment soft} XR AP&Lat of hip shows well placed\fixed THA  Assessment:   1 Day Post-Op Procedure(s) (LRB): LEFT TOTAL HIP ARTHROPLASTY ANTERIOR APPROACH (Left) ADDITIONAL DIAGNOSIS:  Expected Acute Blood Loss Anemia, Hypertension  Plan: PT/OT WBAT, THA  DVT Prophylaxis: SCDx72 hrs, ASA 325 mg BID x 2 weeks  DISCHARGE PLAN: Home  DISCHARGE NEEDS: HHPT, Walker and 3-in-1 comode seat

## 2017-05-05 NOTE — Progress Notes (Addendum)
Physical Therapy Treatment Patient Details Name: Bryan Levy MRN: [id number] DOB: 29-Sep-1950 Today's Date: 05/05/2017    History of Present Illness Pt is a 66 y/o male s/p elective L THA (anterior hip precautions) on 05/04/17. PMH includes polysubstance abuse, L hip and ankle fx s/p ORIF, MI.    PT Comments    Pt progressing well with mobility. Amb in room with RW and min guard for balance with 1x seated rest break secondary to pain. Educ on anterior hip precautions, positioning, therex, and importance of mobility. Will plan for continued gait training and therex progression this afternoon's session. Pt motivated to participate with therapy.   Follow Up Recommendations  DC plan and follow up therapy as arranged by surgeon;Supervision for mobility/OOB     Equipment Recommendations  None recommended by PT    Recommendations for Other Services       Precautions / Restrictions Precautions Precautions: Anterior Hip Precaution Comments: Pt unable to recall anterior hip precautions; reviewed 3/3 on handout Restrictions Weight Bearing Restrictions: Yes LLE Weight Bearing: Weight bearing as tolerated    Mobility  Bed Mobility Overal bed mobility: Needs Assistance Bed Mobility: Supine to Sit     Supine to sit: Supervision;HOB elevated        Transfers Overall transfer level: Needs assistance Equipment used: Rolling walker (2 wheeled) Transfers: Sit to/from Stand Sit to Stand: Supervision         General transfer comment: Stood with RW and supervision for safety; cues for hand placement on RW. Poor eccentric control when sitting in recliner secondary to pain; educ on technique  Ambulation/Gait Ambulation/Gait assistance: Min guard Ambulation Distance (Feet): 40 Feet (+40) Assistive device: Rolling walker (2 wheeled) Gait Pattern/deviations: Step-through pattern;Antalgic Gait velocity: Decreased Gait velocity interpretation: <1.8 ft/sec, indicative of risk for  recurrent falls General Gait Details: Amb in room with RW and min guard for safety; 1x seated rest break. Per MD note, pt only to amb to BR.    Stairs            Wheelchair Mobility    Modified Rankin (Stroke Patients Only)       Balance Overall balance assessment: Needs assistance Sitting-balance support: No upper extremity supported;Feet supported Sitting balance-Leahy Scale: Good     Standing balance support: Bilateral upper extremity supported;During functional activity Standing balance-Leahy Scale: Poor Standing balance comment: Reliant on UE support                            Cognition Arousal/Alertness: Awake/alert Behavior During Therapy: WFL for tasks assessed/performed Overall Cognitive Status: Within Functional Limits for tasks assessed                                        Exercises Total Joint Exercises Ankle Circles/Pumps: AROM;Both;10 reps;Supine Quad Sets: AROM;Left;10 reps;Supine Heel Slides: AROM;Left;10 reps    General Comments General comments (skin integrity, edema, etc.): Wife present throughout session      Pertinent Vitals/Pain Pain Assessment: Faces Faces Pain Scale: Hurts little more Pain Location: L hip with movement Pain Descriptors / Indicators: Discomfort;Operative site guarding Pain Intervention(s): Monitored during session;Repositioned;Ice applied    Home Living                      Prior Function            PT  Goals (current goals can now be found in the care plan section) Acute Rehab PT Goals Patient Stated Goal: to decrease pain  PT Goal Formulation: With patient Time For Goal Achievement: 05/11/17 Potential to Achieve Goals: Good Progress towards PT goals: Progressing toward goals    Frequency    7X/week      PT Plan Current plan remains appropriate    Co-evaluation              AM-PAC PT "6 Clicks" Daily Activity  Outcome Measure  Difficulty turning over in  bed (including adjusting bedclothes, sheets and blankets)?: None Difficulty moving from lying on back to sitting on the side of the bed? : None Difficulty sitting down on and standing up from a chair with arms (e.g., wheelchair, bedside commode, etc,.)?: A Little Help needed moving to and from a bed to chair (including a wheelchair)?: A Little Help needed walking in hospital room?: A Little Help needed climbing 3-5 steps with a railing? : A Little 6 Click Score: 20    End of Session Equipment Utilized During Treatment: Gait belt Activity Tolerance: Patient tolerated treatment well Patient left: in chair;with call bell/phone within reach;with family/visitor present;with nursing/sitter in room Nurse Communication: Mobility status PT Visit Diagnosis: Other abnormalities of gait and mobility (R26.89);Pain Pain - Right/Left: Left Pain - part of body: Hip     Time: 1610-9604 PT Time Calculation (min) (ACUTE ONLY): 22 min  Charges:  $Gait Training: 8-22 mins                    G Codes:      Ina Homes, PT, DPT Acute Rehab Services  Pager: [phone]  Malachy Chamber 05/05/2017, 10:24 AM

## 2017-05-06 LAB — CBC
HEMATOCRIT: 29.1 % — AB (ref 39.0–52.0)
Hemoglobin: 9.3 g/dL — ABNORMAL LOW (ref 13.0–17.0)
MCH: 27.6 pg (ref 26.0–34.0)
MCHC: 32 g/dL (ref 30.0–36.0)
MCV: 86.4 fL (ref 78.0–100.0)
PLATELETS: 240 10*3/uL (ref 150–400)
RBC: 3.37 MIL/uL — ABNORMAL LOW (ref 4.22–5.81)
RDW: 14.1 % (ref 11.5–15.5)
WBC: 8.7 10*3/uL (ref 4.0–10.5)

## 2017-05-06 NOTE — Progress Notes (Signed)
PATIENT ID: Bryan Levy  MRN: [id number]  DOB/AGE:  September 08, 1950 / 66 y.o.  2 Days Post-Op Procedure(s) (LRB): LEFT TOTAL HIP ARTHROPLASTY ANTERIOR APPROACH (Left)    PROGRESS NOTE Subjective: Patient is alert, oriented, no Nausea, no Vomiting, yes passing gas, . Taking PO well. Denies SOB, Chest or Calf Pain. Using Incentive Spirometer, PAS in place. Ambulate WBAT with pt walking 150 ft and practicing steps Patient reports pain as  6/10 with movement  .    Objective: Vital signs in last 24 hours: Vitals:   05/05/17 0536 05/05/17 1300 05/05/17 2012 05/06/17 0515  BP: 125/62 119/72 (!) 142/79 137/64  Pulse: 75 81 78 69  Resp: 20 18 18 18   Temp: 97.8 F (36.6 C) 98.7 F (37.1 C) 98.2 F (36.8 C) 98 F (36.7 C)  TempSrc: Oral Oral Oral Oral  SpO2: 100% 98% 99% 99%      Intake/Output from previous day: I/O last 3 completed shifts: In: 2370 [P.O.:870; I.V.:1500] Out: 2000 [Urine:2000]   Intake/Output this shift: No intake/output data recorded.   LABORATORY DATA:  Recent Labs  05/05/17 0658 05/06/17 0513  WBC 10.9* 8.7  HGB 9.8* 9.3*  HCT 30.4* 29.1*  PLT 254 240  NA 135  --   K 4.5  --   CL 102  --   CO2 27  --   BUN 12  --   CREATININE 1.06  --   GLUCOSE 170*  --   CALCIUM 8.7*  --     Examination: Neurologically intact Neurovascular intact Sensation intact distally Intact pulses distally Dorsiflexion/Plantar flexion intact Incision: scant drainage No cellulitis present Compartment soft} XR AP&Lat of hip shows well placed\fixed THA  Assessment:   2 Days Post-Op Procedure(s) (LRB): LEFT TOTAL HIP ARTHROPLASTY ANTERIOR APPROACH (Left) ADDITIONAL DIAGNOSIS:  Expected Acute Blood Loss Anemia, Hypertension  Plan: PT/OT WBAT, THA  DVT Prophylaxis: SCDx72 hrs, ASA 325 mg BID x 2 weeks  DISCHARGE PLAN: Home  DISCHARGE NEEDS: HHPT, Walker and 3-in-1 comode seat

## 2017-05-06 NOTE — Progress Notes (Signed)
Physical Therapy Treatment Patient Details Name: Bryan Levy MRN: [id number] DOB: [birth year] Today's Date: 05/06/2017    History of Present Illness Pt is a 66 y/o male s/p elective L THA (anterior hip precautions) on 05/04/17. PMH includes polysubstance abuse, L hip and ankle fx s/p ORIF, MI.    PT Comments    Pt has progressed well with mobility. Mod indep for transfers and amb 570' with RW. Pt able to recall 3/3 precautions at beginning of session. Reviewed therex, positioning, fall risk reduction, and importance of continued mobility. Pt encouraged to continue amb with RW in hallway before d/c. All education completed and questions answered. Pt has met short-term acute PT goals. D/c acute PT.   Follow Up Recommendations  DC plan and follow up therapy as arranged by surgeon;Supervision for mobility/OOB     Equipment Recommendations  None recommended by PT    Recommendations for Other Services       Precautions / Restrictions Precautions Precautions: Anterior Hip Precaution Comments: Pt able to recall 3/3 precautions Restrictions Weight Bearing Restrictions: Yes LLE Weight Bearing: Weight bearing as tolerated    Mobility  Bed Mobility Overal bed mobility: Modified Independent                Transfers Overall transfer level: Modified independent Equipment used: Rolling walker (2 wheeled) Transfers: Sit to/from Stand              Ambulation/Gait Ambulation/Gait assistance: Modified independent (Device/Increase time) Ambulation Distance (Feet): 570 Feet Assistive device: Rolling walker (2 wheeled) Gait Pattern/deviations: Decreased stride length;Decreased weight shift to left;Antalgic;Trunk flexed Gait velocity: Decreased   General Gait Details: Mod indep with RW; intermittent cues to maintain upright posture and decrease WB through [place] Mobility    Modified Rankin (Stroke Patients Only)       Balance  Overall balance assessment: Needs assistance Sitting-balance support: No upper extremity supported;Feet supported Sitting balance-Leahy Scale: Good     Standing balance support: Single extremity supported;Bilateral upper extremity supported;During functional activity Standing balance-Leahy Scale: Fair Standing balance comment: Indep for standing at toilet to void and shaving/washing hands at sink with no UE support                            Cognition Arousal/Alertness: Awake/alert Behavior During Therapy: WFL for tasks assessed/performed Overall Cognitive Status: Within Functional Limits for tasks assessed                                        Exercises Total Joint Exercises Long Arc Quad: AROM;Left;20 reps;Seated Marching in Standing: AROM;Left;20 reps;Seated    General Comments        Pertinent Vitals/Pain Pain Assessment: Faces Faces Pain Scale: Hurts a little bit Pain Location: L hip Pain Descriptors / Indicators: Discomfort;Operative site guarding Pain Intervention(s): Monitored during session    Home Living                      Prior Function            PT Goals (current goals can now be found in the care plan section) Acute Rehab PT Goals Patient Stated Goal: to decrease pain  PT Goal Formulation: With patient Time For Goal Achievement: 05/11/17 Potential to Achieve Goals: Good  Progress towards PT goals: Goals met/education completed, patient discharged from PT    Frequency    7X/week      PT Plan Current plan remains appropriate    Co-evaluation              AM-PAC PT "6 Clicks" Daily Activity  Outcome Measure  Difficulty turning over in bed (including adjusting bedclothes, sheets and blankets)?: None Difficulty moving from lying on back to sitting on the side of the bed? : None Difficulty sitting down on and standing up from a chair with arms (e.g., wheelchair, bedside commode, etc,.)?: None Help  needed moving to and from a bed to chair (including a wheelchair)?: None Help needed walking in hospital room?: None Help needed climbing 3-5 steps with a railing? : A Little 6 Click Score: 23    End of Session Equipment Utilized During Treatment: Gait belt Activity Tolerance: Patient tolerated treatment well Patient left: in chair;with call bell/phone within reach Nurse Communication: Mobility status PT Visit Diagnosis: Other abnormalities of gait and mobility (R26.89);Pain Pain - Right/Left: Left Pain - part of body: Hip     Time: 6720-9470 PT Time Calculation (min) (ACUTE ONLY): 13 min  Charges:  $Gait Training: 8-22 mins                    G Codes:      Mabeline Caras, PT, DPT Acute Rehab Services  Pager: [place] 05/06/2017, 7:58 AM

## 2017-05-06 NOTE — Discharge Summary (Signed)
Patient ID: Bryan Levy MRN: [id number] DOB/AGE: 01-30-1951 66 y.o.  Admit date: 05/04/2017 Discharge date: 05/06/2017  Admission Diagnoses:  Principal Problem:   Osteoarthritis of left hip Active Problems:   Primary osteoarthritis of left hip   Discharge Diagnoses:  Same  Past Medical History:  Diagnosis Date  . Ankle dislocation, left, initial encounter 09/12/2016  . Chest pain, unspecified   . Closed posterior dislocation of left hip (HCC) 09/12/2016  . GERD (gastroesophageal reflux disease)   . Heart attack (HCC) 2008  . Hyperlipidemia   . Hypertension   . Intermediate coronary syndrome (HCC) 04/23/2017   pt believes related to panic attack  . Personal history of tobacco use, presenting hazards to health   . Polysubstance abuse (HCC)   . Shortness of breath     Surgeries: Procedure(s): LEFT TOTAL HIP ARTHROPLASTY ANTERIOR APPROACH on 05/04/2017   Consultants:   Discharged Condition: Improved  Hospital Course: Bryan Levy is an 66 y.o. male who was admitted 05/04/2017 for operative treatment ofOsteoarthritis of left hip. Patient has severe unremitting pain that affects sleep, daily activities, and work/hobbies. After pre-op clearance the patient was taken to the operating room on 05/04/2017 and underwent  Procedure(s): LEFT TOTAL HIP ARTHROPLASTY ANTERIOR APPROACH.    Patient was given perioperative antibiotics: Anti-infectives    Start     Dose/Rate Route Frequency Ordered Stop   05/04/17 0715  ceFAZolin (ANCEF) IVPB 2g/100 mL premix     2 g 200 mL/hr over 30 Minutes Intravenous On call to O.R. 05/01/17 1018 05/04/17 0755       Patient was given sequential compression devices, early ambulation, and chemoprophylaxis to prevent DVT.  Patient benefited maximally from hospital stay and there were no complications.    Recent vital signs: Patient Vitals for the past 24 hrs:  BP Temp Temp src Pulse Resp SpO2  05/06/17 0515 137/64 98 F (36.7 C) Oral 69 18  99 %  05/05/17 2012 (!) 142/79 98.2 F (36.8 C) Oral 78 18 99 %  05/05/17 1300 119/72 98.7 F (37.1 C) Oral 81 18 98 %     Recent laboratory studies:  Recent Labs  05/05/17 0658 05/06/17 0513  WBC 10.9* 8.7  HGB 9.8* 9.3*  HCT 30.4* 29.1*  PLT 254 240  NA 135  --   K 4.5  --   CL 102  --   CO2 27  --   BUN 12  --   CREATININE 1.06  --   GLUCOSE 170*  --   CALCIUM 8.7*  --      Discharge Medications:   Allergies as of 05/06/2017      Reactions   Lisinopril Swelling      Medication List    TAKE these medications   amLODipine 5 MG tablet Commonly known as:  NORVASC Take 5 mg by mouth daily.   aspirin EC 325 MG tablet Take 1 tablet (325 mg total) by mouth 2 (two) times daily. What changed:  medication strength  how much to take  when to take this   BL VITAMIN B-12 500 MCG tablet Generic drug:  cyanocobalamin Take 1,000 mcg by mouth daily.   carvedilol 6.25 MG tablet Commonly known as:  COREG Take 6.25 mg by mouth 2 (two) times daily with a meal.   cetirizine 10 MG tablet Commonly known as:  ZYRTEC Take 10 mg by mouth daily.   FISH OIL PO Take 1 capsule by mouth every other day.   oxyCODONE-acetaminophen 5-325  MG tablet Commonly known as:  ROXICET Take 1 tablet by mouth every 4 (four) hours as needed.   pantoprazole 40 MG tablet Commonly known as:  PROTONIX Take 40 mg by mouth daily.   pravastatin 40 MG tablet Commonly known as:  PRAVACHOL Take 1 tablet (40 mg total) by mouth daily.   tiZANidine 2 MG tablet Commonly known as:  ZANAFLEX Take 1 tablet (2 mg total) by mouth every 6 (six) hours as needed for muscle spasms.   VITAMIN D3 PO Take 1 capsule by mouth every other day.            Durable Medical Equipment        Start     Ordered   05/04/17 1207  DME Walker rolling  Once    Question:  Patient needs a walker to treat with the following condition  Answer:  Status post total hip replacement, left   05/04/17 1206   05/04/17  1207  DME 3 n 1  Once     05/04/17 1206   05/04/17 1207  DME Bedside commode  Once    Question:  Patient needs a bedside commode to treat with the following condition  Answer:  Status post total hip replacement, left   05/04/17 1206      Diagnostic Studies: Dg C-arm 1-60 Min  Result Date: 05/04/2017 CLINICAL DATA:  Status post total hip arthroplasty EXAM: OPERATIVE LEFT HIP 1 VIEW TECHNIQUE: Fluoroscopic spot image(s) were submitted for interpretation post-operatively. COMPARISON:  None. FLUOROSCOPY TIME:  0 MINUTES 40 SECONDS; 1 ACQUIRED IMAGE FINDINGS: Frontal left hip image obtained. There is a total hip replacement on the left with prosthetic component well-seated on frontal view. Postoperative changes noted in the left acetabulum. No acute fracture or dislocation evident. IMPRESSION: Total hip replacement on the left with prosthetic components well-seated on frontal view. Postoperative change left acetabulum. No acute fracture or dislocation evident. Electronically Signed   By: Bretta BangWilliam  Woodruff III M.D.   On: 05/04/2017 09:34   Dg Hip Operative Unilat W Or W/o Pelvis Left  Result Date: 05/04/2017 CLINICAL DATA:  Status post total hip arthroplasty EXAM: OPERATIVE LEFT HIP 1 VIEW TECHNIQUE: Fluoroscopic spot image(s) were submitted for interpretation post-operatively. COMPARISON:  None. FLUOROSCOPY TIME:  0 MINUTES 40 SECONDS; 1 ACQUIRED IMAGE FINDINGS: Frontal left hip image obtained. There is a total hip replacement on the left with prosthetic component well-seated on frontal view. Postoperative changes noted in the left acetabulum. No acute fracture or dislocation evident. IMPRESSION: Total hip replacement on the left with prosthetic components well-seated on frontal view. Postoperative change left acetabulum. No acute fracture or dislocation evident. Electronically Signed   By: Bretta BangWilliam  Woodruff III M.D.   On: 05/04/2017 09:34    Disposition: 06-Home-Health Care Svc  Discharge  Instructions    Call MD / Call 911    Complete by:  As directed    If you experience chest pain or shortness of breath, CALL 911 and be transported to the hospital emergency room.  If you develope a fever above 101 F, pus (white drainage) or increased drainage or redness at the wound, or calf pain, call your surgeon's office.   Constipation Prevention    Complete by:  As directed    Drink plenty of fluids.  Prune juice may be helpful.  You may use a stool softener, such as Colace (over the counter) 100 mg twice a day.  Use MiraLax (over the counter) for constipation as needed.   Diet -  low sodium heart healthy    Complete by:  As directed    Driving restrictions    Complete by:  As directed    No driving for 2 weeks   Follow the hip precautions as taught in Physical Therapy    Complete by:  As directed    Increase activity slowly as tolerated    Complete by:  As directed    Patient may shower    Complete by:  As directed    You may shower without a dressing once there is no drainage.  Do not wash over the wound.  If drainage remains, cover wound with plastic wrap and then shower.      Follow-up Information    Gean Birchwood, MD Follow up in 2 week(s).   Specialty:  Orthopedic Surgery Contact information: [address] [place] [address] [phone]            Signed: Vear Clock Martell Mcfadyen R 05/06/2017, 7:28 AM

## 2017-05-06 NOTE — Care Management Note (Signed)
Case Management Note  Patient Details  Name: Bryan Levy MRN: [id number] Date of Birth: Dec 09, 1950  Subjective/Objective:    66 yr old gentleman s/p left total hip arthroplasty, anterior approach.                Action/Plan: Case manager spoke with patient concerning discharge plan and DME. Choice for Home Health Agency was offered. [facility]. Patient has RW and cane, will have family support at discharge.    Expected Discharge Date:  05/06/17               Expected Discharge Plan:  Home w Home Health Services  In-House Referral:  NA  Discharge planning Services  CM Consult  Post Acute Care Choice:  Home Health Choice offered to:  Patient  DME Arranged:  N/A DME Agency:  NA  HH Arranged:  PT HH Agency:  [facility]  Status of Service:  Completed, signed off  If discussed at Long Length of Stay Meetings, dates discussed:    Additional Comments:  Durenda GuthrieBrady, Bryan Asmus Naomi, RN 05/06/2017, 11:19 AM

## 2017-05-11 NOTE — Telephone Encounter (Addendum)
Labs received from outside source.  I had requested multiple labs, [facility] and unfortunately all of the orders may not have been done.   Please find out if they did hepatitis A antibody total, hepatitis B core total antibody, hepatitis B surface antigen, hepatitis B surface antibody, HIV. 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  Necroinflammatory activity score 0.45 high, grade A1 to A2.  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  Bryan Levy had recent labs for surgery: Lab Results  Component Value Date   CREATININE 1.06 05/05/2017   BUN 12 05/05/2017   NA 135 05/05/2017   K 4.5 05/05/2017   CL 102 05/05/2017   CO2 27 05/05/2017   Lab Results  Component Value Date   ALT 61 04/24/2017   AST 61 (H) 04/24/2017   ALKPHOS 117 04/24/2017   BILITOT 0.5 04/24/2017   Lab Results  Component Value Date   INR 1.03 04/23/2017   INR 1.09 09/11/2016   INR 1.10 09/09/2016   WBC 6200, H/H 12.4/38 L, Platelets 262,000.

## 2017-05-12 ENCOUNTER — Ambulatory Visit (INDEPENDENT_AMBULATORY_CARE_PROVIDER_SITE_OTHER): Payer: Managed Care, Other (non HMO) | Admitting: Cardiology

## 2017-05-12 ENCOUNTER — Encounter: Payer: Self-pay | Admitting: Cardiology

## 2017-05-12 ENCOUNTER — Encounter: Payer: Self-pay | Admitting: *Deleted

## 2017-05-12 VITALS — BP 138/80 | HR 84 | Ht 69.0 in | Wt 183.4 lb

## 2017-05-12 DIAGNOSIS — I10 Essential (primary) hypertension: Secondary | ICD-10-CM | POA: Diagnosis not present

## 2017-05-12 DIAGNOSIS — I2510 Atherosclerotic heart disease of native coronary artery without angina pectoris: Secondary | ICD-10-CM

## 2017-05-12 NOTE — Patient Instructions (Signed)
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
## 2017-05-13 NOTE — Telephone Encounter (Signed)
Checked [facility] website, no labs available.  Darl PikesSusan, can you [facility]?

## 2017-05-14 NOTE — Telephone Encounter (Signed)
CALLED AND REQUESTED LABS, THEY ARE SENDING

## 2017-05-18 ENCOUNTER — Telehealth: Payer: Self-pay | Admitting: Gastroenterology

## 2017-05-18 NOTE — Telephone Encounter (Signed)
Open in error

## 2017-05-18 NOTE — Addendum Note (Signed)
Addended by: Tiffany KocherLEWIS, Jareth Pardee S on: 05/18/2017 11:01 AM   Modules accepted: Orders

## 2017-05-18 NOTE — Telephone Encounter (Signed)
Received labs, all to be scanned.  Labs from 04/27/2018.  BUN 12, creatinine 0.9, total bilirubin 0.4, alkaline phosphatase 130, AST 63.9, ALT 61, albumin 4.59.  INR 1.0.  Total hepatitis A antibody positive indicating immunity to hepatitis A.  Hepatitis B surface antigen negative, hepatitis B surface antibody negative, hepatitis B core total positive.  HIV nonreactive.  Patient had positive hepatitis B core total antibody.  Further testing need to be done to determine significance ED if any indication of underlying hepatitis B.  Patient needs additional testing to include the following: Hepatitis B core IgM Hepatitis B core total Hepatitis B surface antigen Hepatitis B surface antibody Anti-hepatitis Be HBV DNA  I HAVE PLACED ORDERS BUT YOU MAY HAVE TO RELEASE??? PLEASE REMIND PATIENT THAT HE WILL NEED TO COMPLETE ABD U/S WITH ELASTOGRAPHY PRIOR TO BEING ABLE TO BE TREATED FOR HEP C.

## 2017-05-19 ENCOUNTER — Telehealth: Payer: Self-pay

## 2017-05-19 NOTE — Telephone Encounter (Signed)
Pt notified that he will need additional labwork at [facility]. Pt is also aware that he will need an ultrasound prior to hep treatment. Pt expressed that he was upset due to having to get another blood draw. Pt stated he has had a hip replacement and isn't driving. Pt will have blood work done this week or next week.

## 2017-06-18 NOTE — Telephone Encounter (Signed)
See phone note on 05/19/17. Pt was aware of this addendum.

## 2017-06-22 ENCOUNTER — Encounter: Payer: Self-pay | Admitting: Gastroenterology

## 2017-06-22 ENCOUNTER — Ambulatory Visit (INDEPENDENT_AMBULATORY_CARE_PROVIDER_SITE_OTHER): Payer: Managed Care, Other (non HMO) | Admitting: Gastroenterology

## 2017-06-22 ENCOUNTER — Other Ambulatory Visit (HOSPITAL_COMMUNITY)
Admission: RE | Admit: 2017-06-22 | Discharge: 2017-06-22 | Disposition: A | Discharge status: Home / Self Care | Payer: Managed Care, Other (non HMO) | Source: Ambulatory Visit | Attending: Gastroenterology | Admitting: Gastroenterology

## 2017-06-22 VITALS — BP 122/79 | HR 74 | Temp 97.8°F | Ht 69.0 in | Wt 180.8 lb

## 2017-06-22 DIAGNOSIS — B182 Chronic viral hepatitis C: Secondary | ICD-10-CM

## 2017-06-22 LAB — HEPATIC FUNCTION PANEL
ALBUMIN: 4.1 g/dL (ref 3.5–5.0)
ALK PHOS: 143 U/L — AB (ref 38–126)
ALT: 49 U/L (ref 17–63)
AST: 36 U/L (ref 15–41)
BILIRUBIN INDIRECT: 0.4 mg/dL (ref 0.3–0.9)
Bilirubin, Direct: 0.2 mg/dL (ref 0.1–0.5)
TOTAL PROTEIN: 7.6 g/dL (ref 6.5–8.1)
Total Bilirubin: 0.6 mg/dL (ref 0.3–1.2)

## 2017-06-22 NOTE — Patient Instructions (Signed)
Attempted to submit PA info for US abd w/elastography via [employer] website. No PA needed.

## 2017-06-22 NOTE — Progress Notes (Signed)
Primary Care Physician: Gennette PacVaughn, Jason C, NP  Primary Gastroenterologist:  Roetta SessionsMichael Rourk, MD   Chief Complaint  Patient presents with  . Hepatitis C    HPI: Bryan Levy is a 66 y.o. male here for f/u of hepatitis C. First seen 04/2017 but postponed hep c treatment due to upcoming surgery. He has since had hip surgery. Doing well. He is interested in pursuing treatment. 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 Fibrosure of F1-F2. Hep B core Ab positive. Hep B surface Antigen negative, HIV nonreactive.   Clinically doing well. Further hep b labs needed as well as U/S with elastography as part of pre-treatment work up of hep C. Denies abd pain, vomiting, constipation, diarrhea, melena, brbpr. No heartburn on pantoprazole.   Drinks a pint of wine every few days but none in the past week.   Current Outpatient Medications  Medication Sig Dispense Refill  . amLODipine (NORVASC) 5 MG tablet Take 5 mg by mouth daily.    Marland Kitchen. aspirin 81 MG tablet Take 81 mg by mouth daily.    . carvedilol (COREG) 6.25 MG tablet Take 6.25 mg by mouth 2 (two) times daily with a meal.    . cetirizine (ZYRTEC) 10 MG tablet Take 10 mg by mouth daily.    . Cholecalciferol (VITAMIN D3 PO) Take 1 capsule by mouth every other day.    . cyanocobalamin (BL VITAMIN B-12) 500 MCG tablet Take 1,000 mcg by mouth daily.      . Omega-3 Fatty Acids (FISH OIL PO) Take 1 capsule by mouth every other day.    . oxyCODONE-acetaminophen (ROXICET) 5-325 MG tablet Take 1 tablet by mouth every 4 (four) hours as needed. 30 tablet 0  . pantoprazole (PROTONIX) 40 MG tablet Take 40 mg by mouth daily.    . pravastatin (PRAVACHOL) 40 MG tablet Take 1 tablet (40 mg total) by mouth daily. 30 tablet 1  . tiZANidine (ZANAFLEX) 2 MG tablet Take 1 tablet (2 mg total) by mouth every 6 (six) hours as needed for muscle spasms. 60 tablet 0   No current facility-administered medications for this visit.     Allergies as of 06/22/2017 - Review  Complete 06/22/2017  Allergen Reaction Noted  . Lisinopril Swelling 04/08/2017    ROS:  General: Negative for anorexia, weight loss, fever, chills, fatigue, weakness. ENT: Negative for hoarseness, difficulty swallowing , nasal congestion. CV: Negative for chest pain, angina, palpitations, dyspnea on exertion, peripheral edema.  Respiratory: Negative for dyspnea at rest, dyspnea on exertion, cough, sputum, wheezing.  GI: See history of present illness. GU:  Negative for dysuria, hematuria, urinary incontinence, urinary frequency, nocturnal urination.  Endo: Negative for unusual weight change.    Physical Examination:   BP 122/79   Pulse 74   Temp 97.8 F (36.6 C) (Oral)   Ht 5\' 9"  (1.753 m)   Wt 180 lb 12.8 oz (82 kg)   BMI 26.70 kg/m   General: Well-nourished, well-developed in no acute distress.  Eyes: No icterus. Mouth: Oropharyngeal mucosa moist and pink , no lesions erythema or exudate. Lungs: Clear to auscultation bilaterally.  Heart: Regular rate and rhythm, no murmurs rubs or gallops.  Abdomen: Bowel sounds are normal, nontender, nondistended, no hepatosplenomegaly or masses, no abdominal bruits or hernia , no rebound or guarding.   Extremities: No lower extremity edema. No clubbing or deformities. Neuro: Alert and oriented x 4   Skin: Warm and dry, no jaundice.   Psych: Alert  and cooperative, normal mood and affect.  Labs:  See hpi Imaging Studies: No results found.

## 2017-06-22 NOTE — Patient Instructions (Signed)
1. Please have your ultrasound done. 2. Please have labs done today at [facility]. Located at [address], [address] [place]. [phone]

## 2017-06-23 LAB — HEPATITIS B CORE ANTIBODY, IGM: Hep B C IgM: NEGATIVE

## 2017-06-23 LAB — HEPATITIS B DNA, ULTRAQUANTITATIVE, PCR
HBV DNA SERPL PCR-ACNC: NOT DETECTED [IU]/mL
HBV DNA SERPL PCR-LOG IU: UNDETERMINED {Log_IU}/mL

## 2017-06-23 LAB — HEPATITIS B E ANTIBODY: Hep B E Ab: NEGATIVE

## 2017-06-23 LAB — HEPATITIS B SURFACE ANTIGEN: Hepatitis B Surface Ag: NEGATIVE

## 2017-06-23 LAB — HEPATITIS B CORE ANTIBODY, TOTAL: Hep B Core Total Ab: POSITIVE — AB

## 2017-06-23 LAB — HEPATITIS B SURFACE ANTIBODY, QUANTITATIVE: Hepatitis B-Post: 5.7 m[IU]/mL — ABNORMAL LOW (ref >9.9)

## 2017-06-28 NOTE — Assessment & Plan Note (Signed)
HCV genotype 1a, treatment naive, F1-F2 Fibrosure presented for follow up. Hep B core Ab positive. Needs further hep B labs and due u/s with elastography. Encouraged him to continue to abstain from etoh use. Further recommendations to follow.   We will need to check for H.pylori eradication in the future.

## 2017-06-29 ENCOUNTER — Ambulatory Visit (HOSPITAL_COMMUNITY): Admission: RE | Admit: 2017-06-29 | Payer: Managed Care, Other (non HMO) | Source: Ambulatory Visit

## 2017-07-01 NOTE — Progress Notes (Signed)
CC'D TO PCP °

## 2017-07-06 ENCOUNTER — Ambulatory Visit (HOSPITAL_COMMUNITY)
Admission: RE | Admit: 2017-07-06 | Discharge: 2017-07-06 | Disposition: A | Discharge status: Home / Self Care | Payer: Managed Care, Other (non HMO) | Source: Ambulatory Visit | Attending: Gastroenterology | Admitting: Gastroenterology

## 2017-07-06 DIAGNOSIS — B182 Chronic viral hepatitis C: Secondary | ICD-10-CM | POA: Diagnosis not present

## 2017-07-06 DIAGNOSIS — K8020 Calculus of gallbladder without cholecystitis without obstruction: Secondary | ICD-10-CM | POA: Diagnosis not present

## 2017-07-12 NOTE — Progress Notes (Signed)
See lab result note.

## 2017-07-12 NOTE — Progress Notes (Signed)
Please start approval process for Harvoni for 12 weeks. Genotype 1a, treatment naive, non-cirrhotic with counts over 14 million.  Let patient know his u/s showed normal appearing liver but at moderate risk of fibrosis, F2 with some F3.  Needs to avoid etoh.   Appears he had exposure to Hep B before but HBV DNA negative, not immune to hep B based on Hep B surf ab titer. To discuss with Annamarie Majorawn Drazek, NP at [facility].

## 2017-07-17 ENCOUNTER — Encounter: Payer: Self-pay | Admitting: Gastroenterology

## 2017-07-17 NOTE — Progress Notes (Signed)
Discussed Hep B serologies with Annamarie Majorawn Drazek, NP. Patient has been exposed to hep b and cleared virus but has not mounted a good enough response to be immune to hep B.  AFTER HCV treatment is completed, he needs to have HEP B vaccinations. Let's make note to get Hep B vaccinations in six months from now. Please NIC or whatever we need to do to make sure this happens.

## 2017-07-22 ENCOUNTER — Telehealth: Payer: Self-pay | Admitting: Internal Medicine

## 2017-07-22 ENCOUNTER — Telehealth: Payer: Self-pay

## 2017-07-22 NOTE — Telephone Encounter (Signed)
Received a fax approval letter from [facility]. Pts Harvoni has been approved 07/22/2017-10/14/2017.  Medication will be shipped to our office. Will inform pt once medication arrives.

## 2017-07-22 NOTE — Telephone Encounter (Signed)
Please call Maralyn SagoSarah from [employer] at [phone] ext [phone]

## 2017-07-22 NOTE — Telephone Encounter (Signed)
Spoke with Maralyn SagoSarah from [employer] [employer]. Pt is going to be approved for Harvoni. Approval letter and medication will be sent to our office.

## 2017-07-29 ENCOUNTER — Telehealth: Payer: Self-pay | Admitting: Internal Medicine

## 2017-07-29 NOTE — Telephone Encounter (Signed)
[phone] PLEASE CALL PATIENT ABOUT A MEDICATION HE RECEIVED A LETTER THAT HE WAS APPROVED FOR

## 2017-07-29 NOTE — Telephone Encounter (Signed)
Spoke with pt and he will be notified when med arrives this week. Confirmed with [facility] [phone] shipment will be here at the end of the week.

## 2017-08-13 ENCOUNTER — Telehealth: Payer: Self-pay | Admitting: Internal Medicine

## 2017-08-13 NOTE — Telephone Encounter (Signed)
Looking in to rx shipment. Pt is aware.

## 2017-08-13 NOTE — Telephone Encounter (Signed)
Please call patient at [phone]. He has questions about his Harvoni prescription

## 2017-08-13 NOTE — Telephone Encounter (Signed)
[facility] They will be calling the pt soon to discuss copay ect. They gave a number the pt could call to discuss [phone]. Once [facility], shipment will be sent out.

## 2017-08-17 ENCOUNTER — Telehealth: Payer: Self-pay | Admitting: Internal Medicine

## 2017-08-17 NOTE — Telephone Encounter (Signed)
Spoke with pt and he will start the medication after he receives a letter of instructions for Harvoni from our office. Pt is aware that medication was suppose to come to [facility] first.

## 2017-08-17 NOTE — Telephone Encounter (Signed)
Pt received his Harvoni on Friday and was told to call us before he started taking it. Please call him at [phone]

## 2017-08-18 ENCOUNTER — Encounter: Payer: Self-pay | Admitting: Internal Medicine

## 2017-08-18 ENCOUNTER — Other Ambulatory Visit: Payer: Self-pay

## 2017-08-18 MED ORDER — OMEPRAZOLE 20 MG PO CPDR: 20.0000 mg | DELAYED_RELEASE_CAPSULE | Freq: Every day | ORAL | 5 refills | Status: DC

## 2017-08-18 NOTE — Telephone Encounter (Signed)
Pt notified. Pt isn't taking Protonix. [facility].  Misty StanleyStacey can you please schedule pt on 09/02/17 @ 11:30 with [facility]. Pt is aware of appointment time.

## 2017-08-18 NOTE — Telephone Encounter (Signed)
[facility]oni to the pt instead of our office.  Please advise of Harvoni instructions.

## 2017-08-18 NOTE — Telephone Encounter (Signed)
Need updated med list. If he is still on pantoprazole, he needs to stop it and start omeprazole 20mg  daily 30 minutes before breakfast for heartburn. #30, 5 refills. He cannot take pantoprazole with Harvoni.   Bring him in on my next urgent OV, Feb 13th. We will start medication after that visit.   He needs to refrain from etoh use.

## 2017-08-18 NOTE — Telephone Encounter (Signed)
PATIENT SCHEDULED  °

## 2017-08-18 NOTE — Telephone Encounter (Signed)
Sorry if there was confusion. I said IF he was taking pantoprazole we needed to switch to omeprazole.   IF he is not taking pantoprazole, he doesn't need the omeprazole.

## 2017-09-02 ENCOUNTER — Encounter: Payer: Self-pay | Admitting: Internal Medicine

## 2017-09-02 ENCOUNTER — Telehealth: Payer: Self-pay | Admitting: Gastroenterology

## 2017-09-02 ENCOUNTER — Ambulatory Visit (INDEPENDENT_AMBULATORY_CARE_PROVIDER_SITE_OTHER): Payer: Managed Care, Other (non HMO) | Admitting: Gastroenterology

## 2017-09-02 ENCOUNTER — Encounter: Payer: Self-pay | Admitting: Gastroenterology

## 2017-09-02 VITALS — BP 141/85 | HR 91 | Temp 97.5°F | Ht 69.0 in | Wt 179.8 lb

## 2017-09-02 DIAGNOSIS — B182 Chronic viral hepatitis C: Secondary | ICD-10-CM | POA: Diagnosis not present

## 2017-09-02 NOTE — Assessment & Plan Note (Signed)
HCV genotype 1a, treatment naive, F1-F2 Fibrosure, F2 with some F3 on elastography. Plans for 12 weeks of Harvoni, patient starting 09/03/17. Written and verbal instructions provided. Encouraged to continue to abstain from etoh.   Return in four weeks for follow up and labs.   Once HCV treatment completed, we will plan for HEP B vaccinations and check for H.pylori eradication.  (Reviewed [facility] records. Transferred from OSH for acute UGI bleed. CT abd for abd pain without acute process. Received 2 units of prbcs prior to transfer. At [facility], EGD with ERE, potential M-W tear, H.pylori gastritis on bx. At time of discharge Hgb 9.6. Amoxicillin, clarithromycin, pantoprazole bid provided in 02/2017.)

## 2017-09-02 NOTE — Telephone Encounter (Signed)
Opened in error

## 2017-09-02 NOTE — Patient Instructions (Addendum)
Labs and office visit in four weeks.   Harvoni for Hepatitis C Instructions:   1. Take Harvoni, one tablet daily with or without food. You should take it at approximately the same time every day.  Do not miss a dose. You will start on 09/03/2017 and end on 11/26/2017 (12 weeks of treatment).   2. Do not run out of Harvoni! If you are down to one week of medication left and have not heard about your next shipment, please let us know as soon as possible.   3. If you need to start a new medication, prescription from your doctor or over the counter medication, you need to contact us to make sure it does not interfere with Harvoni. There are several medications that can interfere with Harvoni and can make you sick or make the medication not work. DO NOT RESTART CHOLESTEROL MEDICATION WITHOUT MY APPROVAL.   4. If you need to take a medication for acid reflux, your choices are as follows: 1. TUMS or Rolaids separated at least four hours from [place].  2. Pepcid (famotidine) 40mg  up to twice per day administered with Harvoni or 12 hours apart from Harvoni.  3. Omeprazole 20mg  with Harvoni on EMPTY stomach.   5. Tylenol (acetominophen) and Advil (ibuprofen) are safe to take with Harvoni if needed for headache, fever, pain.   6. You will have blood work done after 4 weeks of treatment to make sure you are tolerating Harvoni and to see if the medication is getting rid of the Hepatitis C.   7. DO NOT stop Harvoni unless instructed to by your provider.  8. You will also have blood work done at the end of treatment and 12-24 weeks after end of treatment.        Patient signature: ____________________  Date: _______________      THESE ARE THE MEDICATIONS WE HAVE YOU CURRENTLY TAKING  Current Outpatient Medications  Medication Sig Dispense Refill  . amLODipine (NORVASC) 5 MG tablet Take 5 mg by mouth daily.    Marland Kitchen. aspirin 81 MG tablet Take 81 mg by mouth daily.    . carvedilol (COREG) 6.25 MG  tablet Take 6.25 mg by mouth 2 (two) times daily with a meal.    . Cholecalciferol (VITAMIN D3 PO) Take 1 capsule by mouth every other day.    . cyanocobalamin (BL VITAMIN B-12) 500 MCG tablet Take 1,000 mcg by mouth daily.      . Ledipasvir-Sofosbuvir (HARVONI) 90-400 MG TABS Take 1 tablet by mouth daily.    . Omega-3 Fatty Acids (FISH OIL PO) Take 1 capsule by mouth every other day.    Marland Kitchen. omeprazole (PRILOSEC) 20 MG capsule Take 1 capsule (20 mg total) by mouth daily. Take 1 capsule (20mg  total) po mouth daily 30 mins before breakfast for heartburn. 30 capsule 5      Patient signature:________________________ Date:___________________

## 2017-09-02 NOTE — Progress Notes (Signed)
Primary Care Physician: Gennette PacVaughn, Jason C, NP  Primary Gastroenterologist:  Roetta SessionsMichael Rourk, MD   Chief Complaint  Patient presents with  . Hepatitis C    has Harvoni but hasn't started    HPI: Bryan EvaCurtis E Rumbaugh is a 67 y.o. male here prior to starting Hep C medication. H/o chronic HCV, treatment naive, genotype 1a, with viral load over 14 million. H/o of positive exposure to Hep B, able to clear virus but did not mount sufficient response. [facility] are to provide Hep B vaccinations post HCV treatment.   Clinically patient is doing well. He denies significant abd pain. BMs 1-3 daily usually in the mornings, range from bristol 4-6. No melena, brbpr. No edema. Heartburn regularly, well managed with omeprazole. Likes to drink wine intermittently. Aware of need to avoid etoh especially while on Harvoni and goal of very limited use at the most at other times.   Current Outpatient Medications  Medication Sig Dispense Refill  . amLODipine (NORVASC) 5 MG tablet Take 5 mg by mouth daily.    Marland Kitchen. aspirin 81 MG tablet Take 81 mg by mouth daily.    . carvedilol (COREG) 6.25 MG tablet Take 6.25 mg by mouth 2 (two) times daily with a meal.    . Cholecalciferol (VITAMIN D3 PO) Take 1 capsule by mouth every other day.    . cyanocobalamin (BL VITAMIN B-12) 500 MCG tablet Take 1,000 mcg by mouth daily.      . Ledipasvir-Sofosbuvir (HARVONI) 90-400 MG TABS Take 1 tablet by mouth daily.    . Omega-3 Fatty Acids (FISH OIL PO) Take 1 capsule by mouth every other day.    Marland Kitchen. omeprazole (PRILOSEC) 20 MG capsule Take 1 capsule (20 mg total) by mouth daily. Take 1 capsule (20mg  total) po mouth daily 30 mins before breakfast for heartburn. 30 capsule 5   No current facility-administered medications for this visit.     Allergies as of 09/02/2017 - Review Complete 09/02/2017  Allergen Reaction Noted  . Lisinopril Swelling 04/08/2017    ROS:  General: Negative for anorexia, weight  loss, fever, chills, fatigue, weakness. ENT: Negative for hoarseness, difficulty swallowing , nasal congestion. CV: Negative for chest pain, angina, palpitations, dyspnea on exertion, peripheral edema.  Respiratory: Negative for dyspnea at rest, dyspnea on exertion, cough, sputum, wheezing.  GI: See history of present illness. GU:  Negative for dysuria, hematuria, urinary incontinence, urinary frequency, nocturnal urination.  Endo: Negative for unusual weight change.    Physical Examination:   BP (!) 141/85   Pulse 91   Temp (!) 97.5 F (36.4 C) (Oral)   Ht 5\' 9"  (1.753 m)   Wt 179 lb 12.8 oz (81.6 kg)   BMI 26.55 kg/m   General: Well-nourished, well-developed in no acute distress.  Eyes: No icterus. Mouth: Oropharyngeal mucosa moist and pink , no lesions erythema or exudate. Lungs: Clear to auscultation bilaterally.  Heart: Regular rate and rhythm, no murmurs rubs or gallops.  Abdomen: Bowel sounds are normal, nontender, nondistended, no hepatosplenomegaly or masses, no abdominal bruits or hernia , no rebound or guarding.   Extremities: No lower extremity edema. No clubbing or deformities. Neuro: Alert and oriented x 4   Skin: Warm and dry, no jaundice.   Psych: Alert and cooperative, normal mood and affect.  Labs:  Lab Results  Component Value Date   ALT 49 06/22/2017   AST 36 06/22/2017   ALKPHOS 143 (H) 06/22/2017   BILITOT 0.6  06/22/2017    Lab Results  Component Value Date   WBC 8.7 05/06/2017   HGB 9.3 (L) 05/06/2017   HCT 29.1 (L) 05/06/2017   MCV 86.4 05/06/2017   PLT 240 05/06/2017   Lab Results  Component Value Date   CREATININE 1.06 05/05/2017   BUN 12 05/05/2017   NA 135 05/05/2017   K 4.5 05/05/2017   CL 102 05/05/2017   CO2 27 05/05/2017    Imaging Studies: No results found.

## 2017-09-03 NOTE — Progress Notes (Signed)
CC'D TO PCP °

## 2017-09-21 ENCOUNTER — Telehealth: Payer: Self-pay | Admitting: Internal Medicine

## 2017-09-21 NOTE — Telephone Encounter (Signed)
Pt has questions about his Harvoni and his insurance being terminated. Please call him at [phone]

## 2017-09-21 NOTE — Telephone Encounter (Signed)
Spoke with pt at 4:43pm. Pt's insurance is changing and he is trying to get Medicaid due to the cost to keep his current insurance. His monthly insurance would be $445.00 monthly and pt can't afford it. Pt needs proof of his condition and reason he should take Harvoni. Papers need to be faxed to [phone] [facility]. Will discuss further with provider.

## 2017-09-23 ENCOUNTER — Telehealth: Payer: Self-pay

## 2017-09-23 NOTE — Telephone Encounter (Signed)
Pt notified and will stop by to sign pt assistance papers.

## 2017-09-23 NOTE — Telephone Encounter (Signed)
Letter completed. Will discuss with Bryan Levy today.

## 2017-09-23 NOTE — Telephone Encounter (Signed)
Spoke with pt for clarification on pts insurance change. Pt received a call from [employer] stating his next Harvoni shipment couldn't be shipped due to his insurance coverage being terminated. Pt call his [employer] health insurance carrier and was told he could get [employer] insurance for $445.00 monthly. Pt isn't able to get cobra due to his monthly salary of $1100.00. Pt has to pay his rent, utilities, food exc with his monthly income.   [facility] per pts request to get Medicaid insurance.   Will call pt assistance to start the process for pt assistance for Harvoni. Pt has 10 pills left as of today.   Will contact pt with updated info in reference to his medication.

## 2017-09-23 NOTE — Telephone Encounter (Signed)
Spoke with pt assistance Reps for 42 mins. Was transferred to the expedited review department. They have pts info and will have to verify why pt lost his insurance and if other insurance is available to him. If pt can get insurance, pt assistance doesn't except pts.  Forms for Pt assistance can be filled out and faxed to [phone].

## 2017-09-28 NOTE — Telephone Encounter (Addendum)
Spoke to [employer] rep and per [employer] policy since pt is eligible to receive [employer], he was denied assistance. The company can get the pt in touch with a case manage that can help the pt figure out the best cobra plan for him if wanted by the pt. Contact number is [phone]

## 2017-09-28 NOTE — Telephone Encounter (Signed)
Please let patient know the status of his application (denied). If he does not get Cobra and get Harvoni before he runs out on 10/03/17 he will have lapse in his treatment and will have to regroup later for potential retreatment. There are no guarantees that his treatment will be successful if he has a lapse in treatment however.   Please have company help assist him with cobra plan if patient agreeable.

## 2017-09-28 NOTE — Telephone Encounter (Signed)
Received a denial letter for Pt Assistance for [employer] from [employer] company. Letter was placed on providers desk.

## 2017-09-28 NOTE — Telephone Encounter (Signed)
It is up to him regarding the blood work. I would complete the Harvoni he has right now. We can check his blood work as soon as he is ready, sounds like he wants to wait until his medicare starts.   Tell him to let me know when he wants to get his blood work so we can check and see if four weeks of Harvoni was able to rid his HCV or not.

## 2017-09-28 NOTE — Telephone Encounter (Signed)
FYI Pt is very upset. Not upset with our office. Upset that he's not able to get the assistance. Pt doesn't want to speak with a case manager about cobra insurance. Pt states that he will wait until he turns 67 to to get medicare.  PT also said he's not getting his blood drawn.

## 2017-09-29 NOTE — Telephone Encounter (Signed)
Noted. Pt is aware 

## 2017-09-30 ENCOUNTER — Ambulatory Visit: Payer: Managed Care, Other (non HMO) | Admitting: Gastroenterology

## 2017-09-30 NOTE — Telephone Encounter (Signed)
Just for documentation purposes. I did reach out to HCV clinic to see if they knew of any other options but it appears there are no further options that we haven't already tried.   They did recommend getting that HCV RNA as planned. If will likely need to be retreated and would choose Harvoni or Epclusa once insured.   Please NIC for OV in 02/2018 just so we don't lose track of this guy.

## 2017-09-30 NOTE — Telephone Encounter (Signed)
Noted. Please nic for OV 02/2018.

## 2017-09-30 NOTE — Telephone Encounter (Signed)
Reminder in epic °

## 2017-10-20 NOTE — Progress Notes (Deleted)
Referring Provider: Gennette Pac, NP Primary Care Physician:  Gennette Pac, NP Primary GI:  Dr.   Bonnetta Barry chief complaint on file.   HPI:   Bryan Levy is a 67 y.o. male who presents for follow-up on chronic hepatitis C.  The patient was last seen in our office 09/02/2017 for the same.  Noted to be treatment nave, genotype 1A, viral load over 14 million.  History of exposure to hepatitis B which he was able to clear but not mount significant response.  Recommended hep B vaccinations after post HCV treatment.  Generally doing well from a GI perspective.  Social alcohol, aware of need to avoid EtOH while on Harvoni.  Recommended 4-week follow-up and labs.  Plan for hepatitis B vaccination and H. pylori eradication testing after completion.  Patient called indicating he was losing his insurance.  He was trying to obtain Medicaid to continue taking Harvoni.  Letter was provided indicating need for continued Harvoni use.  [employer] was unable to fill his next shipment of Harvoni due to insurance termination, he is unable to afford COBRA.  Letter faxed to [facility] office, patient assistance application started with the manufacture.  Patient assistance was denied due to availability of Cobra, even though patient cannot afford it.  It was recommended that he complete the Harvoni medication he has on hand.  We will check his blood work when he is able to.  It was apparent that all options have been exhausted.  He would likely need to be retreated when he obtains insurance.  Recommended office visit 02/2018.  Today he states   Past Medical History:  Diagnosis Date  . Ankle dislocation, left, initial encounter 09/12/2016  . Chest pain, unspecified   . Closed posterior dislocation of left hip (HCC) 09/12/2016  . GERD (gastroesophageal reflux disease)   . Heart attack (HCC) 2008  . Hyperlipidemia   . Hypertension   . Intermediate coronary syndrome (HCC) 04/23/2017   pt believes related to panic  attack  . Personal history of tobacco use, presenting hazards to health   . Polysubstance abuse (HCC)   . Shortness of breath     Past Surgical History:  Procedure Laterality Date  . IRRIGATION AND DEBRIDEMENT KNEE Left 09/11/2016   Procedure: IRRIGATION AND DEBRIDEMENT LEFT KNEE;  Surgeon: Myrene Galas, MD;  Location: [facility] OR;  Service: Orthopedics;  Laterality: Left;  . ORIF ACETABULAR FRACTURE Left 09/11/2016   Procedure: OPEN REDUCTION INTERNAL FIXATION (ORIF) ACETABULAR FRACTURE;  Surgeon: Myrene Galas, MD;  Location: [facility] OR;  Service: Orthopedics;  Laterality: Left;  . ORIF ANKLE FRACTURE Left 09/11/2016   Procedure: OPEN REDUCTION INTERNAL FIXATION (ORIF) LEFT ANKLE FRACTURE;  Surgeon: Myrene Galas, MD;  Location[facility] OR;  Service: Orthopedics;  Laterality: Left;  . TONSILLECTOMY    . TOTAL HIP ARTHROPLASTY Left 05/04/2017   Procedure: LEFT TOTAL HIP ARTHROPLASTY ANTERIOR APPROACH;  Surgeon: Gean Birchwood, MD;  Location: [facility] OR;  Service: Orthopedics;  Laterality: Left;    Current Outpatient Medications  Medication Sig Dispense Refill  . amLODipine (NORVASC) 5 MG tablet Take 5 mg by mouth daily.    Marland Kitchen aspirin 81 MG tablet Take 81 mg by mouth daily.    . carvedilol (COREG) 6.25 MG tablet Take 6.25 mg by mouth 2 (two) times daily with a meal.    . Cholecalciferol (VITAMIN D3 PO) Take 1 capsule by mouth every other day.    . cyanocobalamin (BL VITAMIN B-12) 500 MCG tablet Take 1,000  mcg by mouth daily.      . Ledipasvir-Sofosbuvir (HARVONI) 90-400 MG TABS Take 1 tablet by mouth daily.    . Omega-3 Fatty Acids (FISH OIL PO) Take 1 capsule by mouth every other day.    Marland Kitchen omeprazole (PRILOSEC) 20 MG capsule Take 1 capsule (20 mg total) by mouth daily. Take 1 capsule (20mg  total) po mouth daily 30 mins before breakfast for heartburn. 30 capsule 5   No current facility-administered medications for this visit.     Allergies as of 10/21/2017 - Review Complete 09/02/2017  Allergen Reaction  Noted  . Lisinopril Swelling 04/08/2017    Family History  Problem Relation Age of Onset  . Hypertension Mother   . Hypertension Sister   . Coronary artery disease Unknown        NEGATIVE     Social History   Socioeconomic History  . Marital status: Married    Spouse name: Not on file  . Number of children: Not on file  . Years of education: Not on file  . Highest education level: Not on file  Occupational History  . Not on file  Social Needs  . Financial resource strain: Not on file  . Food insecurity:    Worry: Not on file    Inability: Not on file  . Transportation needs:    Medical: Not on file    Non-medical: Not on file  Tobacco Use  . Smoking status: Current Every Day Smoker    Packs/day: 1.00    Years: 48.00    Pack years: 48.00    Types: Cigarettes  . Smokeless tobacco: Never Used  . Tobacco comment: 15 cigs a day  Substance and Sexual Activity  . Alcohol use: Yes    Frequency: Never    Comment: pint of wine every few days; none in past few days 09/02/17  . Drug use: No    Comment: Polysubstance abuse (tobacco, alcohol and cocaine) Denies cocaine use in greater than 5 years (04/22/2017)  . Sexual activity: Not on file  Lifestyle  . Physical activity:    Days per week: Not on file    Minutes per session: Not on file  . Stress: Not on file  Relationships  . Social connections:    Talks on phone: Not on file    Gets together: Not on file    Attends religious service: Not on file    Active member of club or organization: Not on file    Attends meetings of clubs or organizations: Not on file    Relationship status: Not on file  Other Topics Concern  . Not on file  Social History Narrative   ** Merged History Encounter **       Polysubstance abuse (tobacco, alcohol and cocaine) In the past    Review of Systems: General: Negative for anorexia, weight loss, fever, chills, fatigue, weakness. Eyes: Negative for vision changes.  ENT: Negative for  hoarseness, difficulty swallowing , nasal congestion. CV: Negative for chest pain, angina, palpitations, dyspnea on exertion, peripheral edema.  Respiratory: Negative for dyspnea at rest, dyspnea on exertion, cough, sputum, wheezing.  GI: See history of present illness. GU:  Negative for dysuria, hematuria, urinary incontinence, urinary frequency, nocturnal urination.  MS: Negative for joint pain, low back pain.  Derm: Negative for rash or itching.  Neuro: Negative for weakness, abnormal sensation, seizure, frequent headaches, memory loss, confusion.  Psych: Negative for anxiety, depression, suicidal ideation, hallucinations.  Endo: Negative for unusual weight change.  Heme: Negative for bruising or bleeding. Allergy: Negative for rash or hives.   Physical Exam: There were no vitals taken for this visit. General:   Alert and oriented. Pleasant and cooperative. Well-nourished and well-developed.  Head:  Normocephalic and atraumatic. Eyes:  Without icterus, sclera clear and conjunctiva pink.  Ears:  Normal auditory acuity. Mouth:  No deformity or lesions, oral mucosa pink.  Throat/Neck:  Supple, without mass or thyromegaly. Cardiovascular:  S1, S2 present without murmurs appreciated. Normal pulses noted. Extremities without clubbing or edema. Respiratory:  Clear to auscultation bilaterally. No wheezes, rales, or rhonchi. No distress.  Gastrointestinal:  +BS, soft, non-tender and non-distended. No HSM noted. No guarding or rebound. No masses appreciated.  Rectal:  Deferred  Musculoskalatal:  Symmetrical without gross deformities. Normal posture. Skin:  Intact without significant lesions or rashes. Neurologic:  Alert and oriented x4;  grossly normal neurologically. Psych:  Alert and cooperative. Normal mood and affect. Heme/Lymph/Immune: No significant cervical adenopathy. No excessive bruising noted.    10/20/2017 11:31 AM   Disclaimer: This note was dictated with voice recognition  software. Similar sounding words can inadvertently be transcribed and may not be corrected upon review.

## 2017-10-21 ENCOUNTER — Other Ambulatory Visit (HOSPITAL_COMMUNITY)
Admission: RE | Admit: 2017-10-21 | Discharge: 2017-10-21 | Disposition: A | Discharge status: Home / Self Care | Payer: Managed Care, Other (non HMO) | Source: Ambulatory Visit | Attending: Gastroenterology | Admitting: Gastroenterology

## 2017-10-21 ENCOUNTER — Ambulatory Visit: Payer: Self-pay | Admitting: Nurse Practitioner

## 2017-10-21 DIAGNOSIS — B182 Chronic viral hepatitis C: Secondary | ICD-10-CM | POA: Insufficient documentation

## 2017-10-21 LAB — COMPREHENSIVE METABOLIC PANEL
ALBUMIN: 3.9 g/dL (ref 3.5–5.0)
ALT: 14 U/L — ABNORMAL LOW (ref 17–63)
ANION GAP: 8 (ref 5–15)
AST: 17 U/L (ref 15–41)
Alkaline Phosphatase: 98 U/L (ref 38–126)
BILIRUBIN TOTAL: 0.9 mg/dL (ref 0.3–1.2)
BUN: 14 mg/dL (ref 6–20)
CHLORIDE: 102 mmol/L (ref 101–111)
CO2: 28 mmol/L (ref 22–32)
Calcium: 9.6 mg/dL (ref 8.9–10.3)
Creatinine, Ser: 1.23 mg/dL (ref 0.61–1.24)
GFR calc Af Amer: >60 mL/min (ref >60)
GFR calc non Af Amer: >60 mL/min (ref >60)
GLUCOSE: 139 mg/dL — AB (ref 65–99)
POTASSIUM: 4.1 mmol/L (ref 3.5–5.1)
SODIUM: 138 mmol/L (ref 135–145)
TOTAL PROTEIN: 7.3 g/dL (ref 6.5–8.1)

## 2017-10-21 LAB — CBC WITH DIFFERENTIAL/PLATELET
BASOS ABS: 0 10*3/uL (ref 0.0–0.1)
BASOS PCT: 0 %
EOS ABS: 0.1 10*3/uL (ref 0.0–0.7)
EOS PCT: 1 %
HCT: 42.3 % (ref 39.0–52.0)
Hemoglobin: 13.3 g/dL (ref 13.0–17.0)
Lymphocytes Relative: 28 %
Lymphs Abs: 1.7 10*3/uL (ref 0.7–4.0)
MCH: 26 pg (ref 26.0–34.0)
MCHC: 31.4 g/dL (ref 30.0–36.0)
MCV: 82.6 fL (ref 78.0–100.0)
MONO ABS: 0.5 10*3/uL (ref 0.1–1.0)
MONOS PCT: 8 %
NEUTROS ABS: 3.8 10*3/uL (ref 1.7–7.7)
Neutrophils Relative %: 63 %
PLATELETS: 253 10*3/uL (ref 150–400)
RBC: 5.12 MIL/uL (ref 4.22–5.81)
RDW: 18 % — AB (ref 11.5–15.5)
WBC: 6 10*3/uL (ref 4.0–10.5)

## 2017-10-22 NOTE — Progress Notes (Signed)
Multiple sugars elevated in past year.  Patient should consider seeing PCP for this. Likely diabetes.  LFTs and Hgb good. Await HCV RNA.

## 2017-10-23 LAB — HCV RNA QUANT: HCV Quantitative: NOT DETECTED IU/mL (ref >50)

## 2017-10-26 NOTE — Progress Notes (Signed)
Please let patient know that his Hep C was undetected after four weeks of HCV medication HOWEVER this is not necessarily a CURE.  It is very important that he come back to see us as soon as he feels he can. He told me he wanted to wait until his Medicare kicked in later this year.   I want him to come back in 02/2018 and we will recheck his HCV labs then to see if he has sustained viral response or if he will require retreatment due to lapse in his treatment (patient only took 4 weeks due to loss of insurance).

## 2017-11-18 ENCOUNTER — Telehealth: Payer: Self-pay | Admitting: Internal Medicine

## 2017-11-18 NOTE — Telephone Encounter (Signed)
ON RECALL FOR HEP-B VACCINATION

## 2017-11-19 NOTE — Telephone Encounter (Signed)
Pt is returning to office 02/2018, when he gets new insurance. Will update Hepatitis treatment at that visit.

## 2017-11-27 ENCOUNTER — Ambulatory Visit: Payer: Self-pay | Admitting: Nurse Practitioner

## 2017-12-04 ENCOUNTER — Ambulatory Visit: Payer: Managed Care, Other (non HMO) | Admitting: Nurse Practitioner

## 2018-02-17 ENCOUNTER — Encounter: Payer: Self-pay | Admitting: Cardiology

## 2018-03-01 ENCOUNTER — Ambulatory Visit (INDEPENDENT_AMBULATORY_CARE_PROVIDER_SITE_OTHER): Payer: Medicare Other | Admitting: Gastroenterology

## 2018-03-01 ENCOUNTER — Encounter: Payer: Self-pay | Admitting: Gastroenterology

## 2018-03-01 VITALS — BP 130/83 | HR 72 | Temp 97.1°F | Ht 69.0 in | Wt 187.0 lb

## 2018-03-01 DIAGNOSIS — B182 Chronic viral hepatitis C: Secondary | ICD-10-CM | POA: Diagnosis not present

## 2018-03-01 NOTE — Patient Instructions (Addendum)
1. Please have your labs done. We will contact you with results as available.  2. Please start Hepatitis B vaccinations with your local pharmacy or primary care.  3. Great job on quitting smoking and alcohol use!

## 2018-03-01 NOTE — Progress Notes (Signed)
Primary Care Physician: Gennette PacVaughn, Jason C, NP  Primary Gastroenterologist:  Roetta SessionsMichael Rourk, MD   Chief Complaint  Patient presents with  . Hepatitis C    HPI: Bryan Levy is a 67 y.o. male here for follow up of Hepatitis C. He was last seen in 08/2017. H/O of HCV, genotype 1a, viral load over 14 million, noncirrhotic and at the time treatment nave. Was started on Harvoni, plans for 12 weeks of treatment. At four weeks his HCV RNA was undetectable. Unfortunately he lost his insurance and was unable to afford Cobra and did not qualify for patient assistance so he was unable to complete treatment.  Also with history of prior exposure to hepatitis B but not immune to hepatitis B.  He is back today, has Medicare now. Feels well. Quit smoking and drinking 11/26/17. He denies abd pain. Appetite good. Gained some weight. BM good. No melena, brbpr. No GERD on PPI.  History of upper GI bleed last fall, [facility] showed erosive reflux esophagitis, potential Mallory-Weiss tear, H. pylori gastritis on biopsy.  He was treated with amoxicillin, clarithromycin, pantoprazole twice daily.  Currently on doxycycline for 3 weeks for tick exposure.  Current Outpatient Medications  Medication Sig Dispense Refill  . amLODipine (NORVASC) 5 MG tablet Take 5 mg by mouth daily.    Marland Kitchen. aspirin 81 MG tablet Take 81 mg by mouth daily.    . carvedilol (COREG) 6.25 MG tablet Take 6.25 mg by mouth 2 (two) times daily with a meal.    . Cholecalciferol (VITAMIN D3 PO) Take 1 capsule by mouth once a week.     . cyanocobalamin (BL VITAMIN B-12) 500 MCG tablet Take 1,000 mcg by mouth daily.      Marland Kitchen. doxycycline (ADOXA) 100 MG tablet Take 100 mg by mouth 2 (two) times daily.    . Omega-3 Fatty Acids (FISH OIL PO) Take 1 capsule by mouth every other day.    Marland Kitchen. omeprazole (PRILOSEC) 20 MG capsule Take 1 capsule (20 mg total) by mouth daily. Take 1 capsule (20mg  total) po mouth daily 30 mins before  breakfast for heartburn. 30 capsule 5   No current facility-administered medications for this visit.     Allergies as of 03/01/2018 - Review Complete 03/01/2018  Allergen Reaction Noted  . Lisinopril Swelling 04/08/2017    ROS:  General: Negative for anorexia, weight loss, fever, chills, fatigue, weakness. ENT: Negative for hoarseness, difficulty swallowing , nasal congestion. CV: Negative for chest pain, angina, palpitations, dyspnea on exertion, peripheral edema.  Respiratory: Negative for dyspnea at rest, dyspnea on exertion, cough, sputum, wheezing.  GI: See history of present illness. GU:  Negative for dysuria, hematuria, urinary incontinence, urinary frequency, nocturnal urination.  Endo: Negative for unusual weight change.    Physical Examination:   BP 130/83   Pulse 72   Temp (!) 97.1 F (36.2 C) (Oral)   Ht 5\' 9"  (1.753 m)   Wt 187 lb (84.8 kg)   BMI 27.62 kg/m   General: Well-nourished, well-developed in no acute distress.  Eyes: No icterus. Mouth: Oropharyngeal mucosa moist and pink , no lesions erythema or exudate. Lungs: Clear to auscultation bilaterally.  Heart: Regular rate and rhythm, no murmurs rubs or gallops.  Abdomen: Bowel sounds are normal, nontender, nondistended, no hepatosplenomegaly or masses, no abdominal bruits or hernia , no rebound or guarding.   Extremities: No lower extremity edema. No clubbing or deformities. Neuro: Alert and oriented  x 4   Skin: Warm and dry, no jaundice.   Psych: Alert and cooperative, normal mood and affect.  Labs:  Lab Results  Component Value Date   CREATININE 1.23 10/21/2017   BUN 14 10/21/2017   NA 138 10/21/2017   K 4.1 10/21/2017   CL 102 10/21/2017   CO2 28 10/21/2017   Lab Results  Component Value Date   ALT 14 (L) 10/21/2017   AST 17 10/21/2017   ALKPHOS 98 10/21/2017   BILITOT 0.9 10/21/2017   Lab Results  Component Value Date   WBC 6.0 10/21/2017   HGB 13.3 10/21/2017   HCT 42.3 10/21/2017     MCV 82.6 10/21/2017   PLT 253 10/21/2017     Imaging Studies: No results found.

## 2018-03-01 NOTE — Assessment & Plan Note (Signed)
Received 4 weeks of Harvoni prior to lapse of insurance.  His HCVRNA was undetectable after 4 weeks.  Need to recheck HCVRNA at this time to determine if he will require further treatment.  Would advise him to complete hepatitis B vaccination.  Prescription provided for him to have done with his PCP or local pharmacy.  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 pylori gastritis.  He was treated with amoxicillin, clarithromycin, pantoprazole in August 2018.  Plan to check for eradication in the future but currently patient is on antibiotics for the next 2 to 3 weeks.

## 2018-03-02 ENCOUNTER — Ambulatory Visit (INDEPENDENT_AMBULATORY_CARE_PROVIDER_SITE_OTHER): Payer: Medicare Other | Admitting: Cardiology

## 2018-03-02 ENCOUNTER — Encounter: Payer: Self-pay | Admitting: Cardiology

## 2018-03-02 ENCOUNTER — Other Ambulatory Visit: Payer: Self-pay

## 2018-03-02 ENCOUNTER — Encounter: Payer: Self-pay | Admitting: *Deleted

## 2018-03-02 VITALS — BP 139/82 | HR 61 | Ht 69.0 in | Wt 189.0 lb

## 2018-03-02 DIAGNOSIS — I2510 Atherosclerotic heart disease of native coronary artery without angina pectoris: Secondary | ICD-10-CM | POA: Diagnosis not present

## 2018-03-02 DIAGNOSIS — I10 Essential (primary) hypertension: Secondary | ICD-10-CM

## 2018-03-02 NOTE — Patient Instructions (Signed)
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
## 2018-03-02 NOTE — Progress Notes (Signed)
CC'D TO PCP °

## 2018-09-24 ENCOUNTER — Telehealth: Payer: Self-pay | Admitting: Gastroenterology

## 2018-09-24 NOTE — Telephone Encounter (Signed)
Patient seen in 02/2018. We requested labs and H.pylori testing and he never completed.   Please have patient get HCV RNA quantitative done with genotype.   If he has been off antibiotics and can stop omeprazole for at least two weeks, we need him to check H.pylori breath test to make sure h.pylori successfully cured.

## 2018-09-27 ENCOUNTER — Other Ambulatory Visit: Payer: Self-pay

## 2018-09-27 DIAGNOSIS — B182 Chronic viral hepatitis C: Secondary | ICD-10-CM

## 2018-09-27 NOTE — Telephone Encounter (Signed)
Spoke to pt. He is going to have the blood work and breath test done. Pt wanted the orders faxed to [facility]. Labs have been faxed and orders mailed to pt per pts request.

## 2018-10-20 LAB — HEPATITIS C GENOTYPE

## 2018-10-20 LAB — HCV RNA QUANT: HEPATITIS C QUANTITATION: NOT DETECTED [IU]/mL

## 2018-10-20 LAB — H. PYLORI BREATH TEST: H PYLORI BREATH TEST: NEGATIVE

## 2018-10-26 ENCOUNTER — Encounter: Payer: Self-pay | Admitting: Internal Medicine

## 2018-11-19 HISTORY — PX: COLONOSCOPY: SHX174
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
## 2019-03-01 ENCOUNTER — Telehealth: Payer: Self-pay | Admitting: Cardiology

## 2019-03-01 NOTE — Telephone Encounter (Signed)
Virtual Visit Pre-Appointment Phone Call  "(Name), I am calling you today to discuss your upcoming appointment. We are currently trying to limit exposure to the virus that causes COVID-19 by seeing patients at home rather than in the office."  1. "What is the BEST phone number to call the day of the visit?" - include this in appointment notes  2. Do you have or have access to (through a family member/friend) a smartphone with video capability that we can use for your visit?" a. If yes - list this number in appt notes as cell (if different from BEST phone #) and list the appointment type as a VIDEO visit in appointment notes b. If no - list the appointment type as a PHONE visit in appointment notes  3. Confirm consent - "In the setting of the current Covid19 crisis, you are scheduled for a (phone or video) visit with your provider on (date) at (time).  Just as we do with many in-office visits, in order for you to participate in this visit, we must obtain consent.  If you'd like, I can send this to your mychart (if signed up) or email for you to review.  Otherwise, I can obtain your verbal consent now.  All virtual visits are billed to your insurance company just like a normal visit would be.  By agreeing to a virtual visit, we'd like you to understand that the technology does not allow for your provider to perform an examination, and thus may limit your provider's ability to fully assess your condition. If your provider identifies any concerns that need to be evaluated in person, we will make arrangements to do so.  Finally, though the technology is pretty good, we cannot assure that it will always work on either your or our end, and in the setting of a video visit, we may have to convert it to a phone-only visit.  In either situation, we cannot ensure that we have a secure connection.  Are you willing to proceed?" STAFF: Did the patient verbally acknowledge consent to telehealth visit? Document  YES/NO here: yes  4. Advise patient to be prepared - "Two hours prior to your appointment, go ahead and check your blood pressure, pulse, oxygen saturation, and your weight (if you have the equipment to check those) and write them all down. When your visit starts, your provider will ask you for this information. If you have an Apple Watch or Kardia device, please plan to have heart rate information ready on the day of your appointment. Please have a pen and paper handy nearby the day of the visit as well."  5. Give patient instructions for MyChart download to smartphone OR [email or web address] as below if video visit (depending on what platform provider is using)  6. Inform patient they will receive a phone call 15 minutes prior to their appointment time (may be from unknown caller ID) so they should be prepared to answer    TELEPHONE CALL NOTE  Bryan Levy has been deemed a candidate for a follow-up tele-health visit to limit community exposure during the Covid-19 pandemic. I spoke with the patient via phone to ensure availability of phone/video source, confirm preferred email & phone number, and discuss instructions and expectations.  I reminded Bryan Levy to be prepared with any vital sign and/or heart rhythm information that could potentially be obtained via home monitoring, at the time of his visit. I reminded Bryan Levy to expect a phone call prior to  his visit.  Bryan Levy 03/01/2019 9:14 AM   INSTRUCTIONS FOR DOWNLOADING THE MYCHART APP TO SMARTPHONE  - The patient must first make sure to have activated MyChart and know their login information - If Apple, go to [employer] and type in MyChart in the search bar and download the app. If Android, ask patient to go to [employer] and type in [place] in the search bar and download the app. The app is free but as with any other app downloads, their phone may require them to verify saved payment information or Apple/Android  password.  - The patient will need to then log into the app with their MyChart username and password, and select [facility] as their healthcare provider to link the account. When it is time for your visit, go to the MyChart app, find appointments, and click Begin Video Visit. Be sure to Select Allow for your device to access the Microphone and Camera for your visit. You will then be connected, and your provider will be with you shortly.  **If they have any issues connecting, or need assistance please contact MyChart service desk [phone])**  **If using a computer, in order to ensure the best quality for their visit they will need to use either of the following Internet Browsers: [employer], or Google Chrome**  IF USING DOXIMITY or DOXY.ME - The patient will receive a link just prior to their visit by text.     FULL LENGTH CONSENT FOR TELE-HEALTH VISIT   I hereby voluntarily request, consent and authorize [facility] and its employed or contracted physicians, physician assistants, nurse practitioners or other licensed health care professionals (the Practitioner), to provide me with telemedicine health care services (the Services") as deemed necessary by the treating Practitioner. I acknowledge and consent to receive the Services by the Practitioner via telemedicine. I understand that the telemedicine visit will involve communicating with the Practitioner through live audiovisual communication technology and the disclosure of certain medical information by electronic transmission. I acknowledge that I have been given the opportunity to request an in-person assessment or other available alternative prior to the telemedicine visit and am voluntarily participating in the telemedicine visit.  I understand that I have the right to withhold or withdraw my consent to the use of telemedicine in the course of my care at any time, without affecting my right to future care or treatment,  and that the Practitioner or I may terminate the telemedicine visit at any time. I understand that I have the right to inspect all information obtained and/or recorded in the course of the telemedicine visit and may receive copies of available information for a reasonable fee.  I understand that some of the potential risks of receiving the Services via telemedicine include:   Delay or interruption in medical evaluation due to technological equipment failure or disruption;  Information transmitted may not be sufficient (e.g. poor resolution of images) to allow for appropriate medical decision making by the Practitioner; and/or   In rare instances, security protocols could fail, causing a breach of personal health information.  Furthermore, I acknowledge that it is my responsibility to provide information about my medical history, conditions and care that is complete and accurate to the best of my ability. I acknowledge that Practitioner's advice, recommendations, and/or decision may be based on factors not within their control, such as incomplete or inaccurate data provided by me or distortions of diagnostic images or specimens that may result from electronic transmissions. I  understand that the practice of medicine is not an exact science and that Practitioner makes no warranties or guarantees regarding treatment outcomes. I acknowledge that I will receive a copy of this consent concurrently upon execution via email to the email address I last provided but may also request a printed copy by calling the office of [facility].    I understand that my insurance will be billed for this visit.   I have read or had this consent read to me.  I understand the contents of this consent, which adequately explains the benefits and risks of the Services being provided via telemedicine.   I have been provided ample opportunity to ask questions regarding this consent and the Services and have had my questions  answered to my satisfaction.  I give my informed consent for the services to be provided through the use of telemedicine in my medical care  By participating in this telemedicine visit I agree to the above.

## 2019-03-02 ENCOUNTER — Ambulatory Visit (INDEPENDENT_AMBULATORY_CARE_PROVIDER_SITE_OTHER): Payer: Medicare Other | Admitting: Gastroenterology

## 2019-03-02 ENCOUNTER — Other Ambulatory Visit: Payer: Self-pay

## 2019-03-02 ENCOUNTER — Encounter: Payer: Self-pay | Admitting: Gastroenterology

## 2019-03-02 DIAGNOSIS — Z8719 Personal history of other diseases of the digestive system: Secondary | ICD-10-CM | POA: Diagnosis not present

## 2019-03-02 DIAGNOSIS — B182 Chronic viral hepatitis C: Secondary | ICD-10-CM | POA: Diagnosis not present

## 2019-03-02 NOTE — Assessment & Plan Note (Signed)
HCV RNA one year post 4 weeks of Harvoni was negative. F2 with some F3 on elastography, moderate risk of fibrosis. Will plan to update u/s in 3 months to reassess liver and look for any evidence of cirrhosis. Return to the office in one year.   Patient had recent admission for rectal bleeding. Obtain copy of labs and colonoscopy with path.

## 2019-03-02 NOTE — Progress Notes (Signed)
cc'd to pcp 

## 2019-03-02 NOTE — Progress Notes (Signed)
Primary Care Physician:  Sandi Mealy, MD  Primary Gastroenterologist:  Garfield Cornea, MD   Chief Complaint  Patient presents with  . Follow-up    HPI:  Bryan Levy is a 68 y.o. male here for follow-up.  He was last seen in August 2019.  History of hepatitis C, genotype 1a, viral load over 14 million, noncirrhotic. F2 with some F3 on elastography. He completed Harvoni, only 4 weeks of therapy because he lost his insurance.  Because he was COBRA eligible he did not qualify for patient assistance.  Fortunately he has SVR noted 1 year out of treatment.  Previously exposed hepatitis B but HBV DNA negative, not immune to hep B based on hep B surface antibody titer. Hepatitis B vaccinations recommended.  EGD follow-up 2018 at [facility] showed erosive reflux esophagitis, potential Mallory-Weiss tear, H. pylori gastritis on biopsy.  He was treated with a amoxicillin, clarithromycin, pantoprazole twice daily.  Confirmed eradication of H. pylori breath test in March 2020.  Patient is coming in today to consider a colonoscopy. He says since this ov was made he was admitted to [facility] for rectal bleeding.  States that time of discharge, he was set up for colonoscopy with Dr. Ladona Horns.  Believes he had a polyp.  At this time he is doing well.  No further rectal bleeding.  No melena.  Bowel movements 3/day.  No abdominal pain.  Because he takes omeprazole he has no heartburn.  No dysphagia.  Has gained 5 or 6 pounds.  No alcohol in 1 year except for on 4 July.  Admits that he drank enough to get drunk.  Denies any illicit drug use.    Current Outpatient Medications  Medication Sig Dispense Refill  . aspirin 81 MG tablet Take 81 mg by mouth daily.    . carvedilol (COREG) 6.25 MG tablet Take 6.25 mg by mouth 2 (two) times daily with a meal.    . Cholecalciferol (VITAMIN D3 PO) Take 1 capsule by mouth once a week.     . cyanocobalamin (BL VITAMIN B-12) 500 MCG tablet Take 1,000  mcg by mouth daily.      Marland Kitchen omeprazole (PRILOSEC) 20 MG capsule Take 1 capsule (20 mg total) by mouth daily. Take 1 capsule (20mg  total) po mouth daily 30 mins before breakfast for heartburn. 30 capsule 5  . doxycycline (ADOXA) 100 MG tablet Take 100 mg by mouth 2 (two) times daily.    . Omega-3 Fatty Acids (FISH OIL PO) Take 1 capsule by mouth every other day.     No current facility-administered medications for this visit.     Allergies as of 03/02/2019 - Review Complete 03/02/2019  Allergen Reaction Noted  . Lisinopril Swelling 04/08/2017    ROS:  General: Negative for anorexia, weight loss, fever, chills, fatigue, weakness. Eyes: Negative for vision changes.  ENT: Negative for hoarseness, difficulty swallowing , nasal congestion. CV: Negative for chest pain, angina, palpitations, dyspnea on exertion, peripheral edema.  Respiratory: Negative for dyspnea at rest, dyspnea on exertion, cough, sputum, wheezing.  GI: See history of present illness. GU:  Negative for dysuria, hematuria, urinary incontinence, urinary frequency, nocturnal urination.  MS: Negative for joint pain, low back pain.  Derm: Negative for rash or itching.  Neuro: Negative for weakness, abnormal sensation, seizure, frequent headaches, memory loss, confusion.  Psych: Negative for anxiety, depression, suicidal ideation, hallucinations.  Endo: Negative for unusual weight change.  Heme: Negative for bruising or bleeding. Allergy: Negative  for rash or hives.    Physical Examination:  BP (!) 152/92   Pulse 71   Temp (!) 97.1 F (36.2 C) (Temporal)   Ht 5\' 9"  (1.753 m)   Wt 188 lb 3.2 oz (85.4 kg)   BMI 27.79 kg/m    General: Well-nourished, well-developed in no acute distress.  Head: Normocephalic, atraumatic.   Eyes: Conjunctiva pink, no icterus. Mouth: masked Neck: Supple without thyromegaly, masses, or lymphadenopathy.  Abdomen: Bowel sounds are normal, nontender, nondistended, no hepatosplenomegaly or  masses, no abdominal bruits or    hernia , no rebound or guarding.   Rectal: Not performed Extremities: No lower extremity edema. No clubbing or deformities.  Neuro: Alert and oriented x 4 , grossly normal neurologically.  Skin: Warm and dry, no rash or jaundice.   Psych: Alert and cooperative, normal mood and affect.  Labs: Lab Results  Component Value Date   CREATININE 1.23 10/21/2017   BUN 14 10/21/2017   NA 138 10/21/2017   K 4.1 10/21/2017   CL 102 10/21/2017   CO2 28 10/21/2017   Lab Results  Component Value Date   ALT 14 (L) 10/21/2017   AST 17 10/21/2017   ALKPHOS 98 10/21/2017   BILITOT 0.9 10/21/2017   Lab Results  Component Value Date   WBC 6.0 10/21/2017   HGB 13.3 10/21/2017   HCT 42.3 10/21/2017   MCV 82.6 10/21/2017   PLT 253 10/21/2017   Lab Results  Component Value Date   INR 1.03 04/23/2017   INR 1.09 09/11/2016   INR 1.10 09/09/2016     Imaging Studies: No results found.

## 2019-03-02 NOTE — Patient Instructions (Addendum)
1. I will obtain copy of your colonoscopy report to scan into your chart.  2. Please continue to abstain from alcohol use. Your previous liver ultrasound shows you are at moderate risk of developing scarring in your liver. Alcohol can increase your risk of scarring.  3. I will obtain copy of your labs done at [facility] for review.  4. Continue omeprazole daily.  5. Abdominal ultrasound in 3 months. We will contact you to schedule closer to due date.  6. Return to the office in one year.

## 2019-03-04 ENCOUNTER — Encounter: Payer: Self-pay | Admitting: Cardiology

## 2019-03-04 ENCOUNTER — Telehealth (INDEPENDENT_AMBULATORY_CARE_PROVIDER_SITE_OTHER): Payer: Medicare Other | Admitting: Cardiology

## 2019-03-04 VITALS — BP 152/92 | HR 71 | Ht 69.0 in | Wt 188.0 lb

## 2019-03-04 DIAGNOSIS — I2510 Atherosclerotic heart disease of native coronary artery without angina pectoris: Secondary | ICD-10-CM

## 2019-03-04 DIAGNOSIS — I10 Essential (primary) hypertension: Secondary | ICD-10-CM

## 2019-03-04 MED ORDER — AMLODIPINE BESYLATE 5 MG PO TABS: 5.0000 mg | ORAL_TABLET | Freq: Every day | ORAL | 3 refills | Status: DC

## 2019-03-04 NOTE — Patient Instructions (Signed)
Medication Instructions:  Your physician recommends that you continue on your current medications as directed. Please refer to the Current Medication list given to you today. Start Norvasc 5 mg Daily  If you need a refill on your cardiac medications before your next appointment, please call your pharmacy.   Lab work: NONE  If you have labs (blood work) drawn today and your tests are completely normal, you will receive your results only by: Marland Kitchen MyChart Message (if you have MyChart) OR . A paper copy in the mail If you have any lab test that is abnormal or we need to change your treatment, we will call you to review the results.  Testing/Procedures: NONE   Follow-Up: At [facility], you and your health needs are our priority.  As part of our continuing mission to provide you with exceptional heart care, we have created designated Provider Care Teams.  These Care Teams include your primary Cardiologist (physician) and Advanced Practice Providers (APPs -  Physician Assistants and Nurse Practitioners) who all work together to provide you with the care you need, when you need it. You will need a follow up appointment in 1 years.  Please call our office 2 months in advance to schedule this appointment.  You may see Carlyle Dolly, MD or one of the following Advanced Practice Providers on your designated Care Team:   Bernerd Pho, PA-C [facility]) . Ermalinda Barrios, PA-C ([place])  Any Other Special Instructions Will Be Listed Below (If Applicable). Thank you for choosing [facility]!

## 2019-03-04 NOTE — Progress Notes (Signed)
Virtual Visit via Telephone Note   This visit type was conducted due to national recommendations for restrictions regarding the COVID-19 Pandemic (e.g. social distancing) in an effort to limit this patient's exposure and mitigate transmission in our community.  Due to his co-morbid illnesses, this patient is at least at moderate risk for complications without adequate follow up.  This format is felt to be most appropriate for this patient at this time.  The patient did not have access to video technology/had technical difficulties with video requiring transitioning to audio format only (telephone).  All issues noted in this document were discussed and addressed.  No physical exam could be performed with this format.  Please refer to the patient's chart for his  consent to telehealth for [facility].   Date:  03/04/2019   ID:  Bryan Levy, DOB Sep 14, 1950, MRN [id number]  Patient Location: Home Provider Location: Office  PCP:  Kela MillinBarrino, Alethea Y, MD  Cardiologist:  Dina RichBranch, Cabe Lashley, MD  Electrophysiologist:  None   Evaluation Performed:  Follow-Up Visit  Chief Complaint:  Follow up visit  History of Present Illness:    Bryan Levy is a 68 y.o. male seen today for follow up of the following medical problems.  1. CAD - NSTEMI 03/2010, normal coronaries at that time. D-dimer negative. - 02/2017 nuclear stressindependently reviewed, mild inferolateral ischemia.Evaluation limted by adjacent gut uptake. Degree of ischemia if present would still represent low risk.  02/2017 echo: LVEF 55-60%, grade I diastolic dysfunction, no WMAs   - no recent chest pain. No SOB or DOE.  -    2. HTN -looks like his norvasc expired and he stopped taking.     3. AAA screen - 08/2016 CT abd no AAA.  4. Hep C - followed by GI  5. History of GI bleed - followed by GI. From notes history of erosive reflux esophagitis      06/2018 TC 107 TG 68 HDL 25 LDL 68   The patient  does not have symptoms concerning for COVID-19 infection (fever, chills, cough, or new shortness of breath).    Past Medical History:  Diagnosis Date  . Ankle dislocation, left, initial encounter 09/12/2016  . Chest pain, unspecified   . Closed posterior dislocation of left hip (HCC) 09/12/2016  . GERD (gastroesophageal reflux disease)   . Heart attack (HCC) 2008  . Hyperlipidemia   . Hypertension   . Intermediate coronary syndrome (HCC) 04/23/2017   pt believes related to panic attack  . Personal history of tobacco use, presenting hazards to health   . Polysubstance abuse (HCC)   . Shortness of breath    Past Surgical History:  Procedure Laterality Date  . IRRIGATION AND DEBRIDEMENT KNEE Left 09/11/2016   Procedure: IRRIGATION AND DEBRIDEMENT LEFT KNEE;  Surgeon: Myrene GalasMichael Handy, MD;  [facility];  Service: Orthopedics;  Laterality: Left;  . ORIF ACETABULAR FRACTURE Left 09/11/2016   Procedure: OPEN REDUCTION INTERNAL FIXATION (ORIF) ACETABULAR FRACTURE;  Surgeon: Myrene GalasMichael Handy, MD;  [facility];  Service: Orthopedics;  Laterality: Left;  . ORIF ANKLE FRACTURE Left 09/11/2016   Procedure: OPEN REDUCTION INTERNAL FIXATION (ORIF) LEFT ANKLE FRACTURE;  Surgeon: Myrene GalasMichael Handy, MD;  [facility];  Service: Orthopedics;  Laterality: Left;  . TONSILLECTOMY    . TOTAL HIP ARTHROPLASTY Left 05/04/2017   Procedure: LEFT TOTAL HIP ARTHROPLASTY ANTERIOR APPROACH;  Surgeon: Gean Birchwoodowan, Frank, MD;  Location: [facility] OR;  Service: Orthopedics;  Laterality: Left;     Current Meds  Medication Sig  . aspirin 81 MG tablet Take 81 mg by mouth daily.  . carvedilol (COREG) 6.25 MG tablet Take 6.25 mg by mouth 2 (two) times daily with a meal.  . Cholecalciferol (VITAMIN D3 PO) Take 1 capsule by mouth once a week.   . cyanocobalamin (BL VITAMIN B-12) 500 MCG tablet Take 1,000 mcg by mouth daily.    . Omega-3 Fatty Acids (FISH OIL PO) Take 1 capsule by mouth every other day.  Marland Kitchen. omeprazole (PRILOSEC) 20 MG  capsule Take 1 capsule (20 mg total) by mouth daily. Take 1 capsule (20mg  total) po mouth daily 30 mins before breakfast for heartburn.     Allergies:   Lisinopril   Social History   Tobacco Use  . Smoking status: Former Smoker    Packs/day: 1.00    Years: 48.00    Pack years: 48.00    Types: Cigarettes  . Smokeless tobacco: Never Used  . Tobacco comment: one pack every day and a half  Substance Use Topics  . Alcohol use: Not Currently    Frequency: Never    Comment: pint of wine every few days; 03/01/18-quit 11/26/17  . Drug use: No    Comment: Polysubstance abuse (tobacco, alcohol and cocaine) Denies cocaine use in greater than 5 years (04/22/2017)     Family Hx: The patient's family history includes Coronary artery disease in an other family member; Hypertension in his mother and sister.  ROS:   Please see the history of present illness.    All other systems reviewed and are negative.   Prior CV studies:   The following studies were reviewed today:  03/2010 cath Nonobstructive CAD  02/2017 nuclear stress  Inferior/inferolateral defect that improves incompletely consistent with ischemia in inferolateral wall (base, minimally mid) and scar. Coexistent soft tissue attenuation (diaphragm, overlying bowel activity) makes evaluation difficult  This is a high risk study.  Nuclear stress EF: 38%.  02/2017 echo Study Conclusions  - Left ventricle: The cavity size was normal. Wall thickness was normal. Systolic function was normal. The estimated ejection fraction was in the range of 55% to 60%. Wall motion was normal; there were no regional wall motion abnormalities. Doppler parameters are consistent with abnormal left ventricular relaxation (grade 1 diastolic dysfunction). - Aortic valve: Valve area (VTI): 2.74 cm^2. Valve area (Vmax): 2.42 cm^2. Valve area (Vmean): 2.36 cm^2. - Technically adequate study.  Labs/Other Tests and Data Reviewed:    EKG:  No  ECG reviewed.  Recent Labs: No results found for requested labs within last 8760 hours.   Recent Lipid Panel Lab Results  Component Value Date/Time   CHOL  04/13/2010 04:45 AM    121        ATP III CLASSIFICATION:  <200     mg/dL   Desirable  161-096200-239  mg/dL   Borderline High  >=045>=240    mg/dL   High          TRIG 90 04/13/2010 04:45 AM   HDL 29 (L) 04/13/2010 04:45 AM   CHOLHDL 4.2 04/13/2010 04:45 AM   LDLCALC  04/13/2010 04:45 AM    74        Total Cholesterol/HDL:CHD Risk Coronary Heart Disease Risk Table                     Men   Women  1/2 Average Risk   3.4   3.3  Average Risk       5.0   4.4  2 X Average Risk   9.6   7.1  3 X Average Risk  23.4   11.0        Use the calculated Patient Ratio above and the CHD Risk Table to determine the patient's CHD Risk.        ATP III CLASSIFICATION (LDL):  <100     mg/dL   Optimal  100-129  mg/dL   Near or Above                    Optimal  130-159  mg/dL   Borderline  160-189  mg/dL   High  >190     mg/dL   Very High    Wt Readings from Last 3 Encounters:  03/04/19 188 lb (85.3 kg)  03/02/19 188 lb 3.2 oz (85.4 kg)  03/02/18 189 lb (85.7 kg)     Objective:    Vital Signs:  BP (!) 152/92   Pulse 71   Ht 5\' 9"  (1.753 m)   Wt 188 lb (85.3 kg)   BMI 27.76 kg/m    Today's Vitals   03/04/19 0940  BP: (!) 152/92  Pulse: 71  Weight: 188 lb (85.3 kg)  Height: 5\' 9"  (1.753 m)   Body mass index is 27.76 kg/m.  Normal affect. Normal speech pattern and tone. Comfrotable, no apparent distress. No audible signs of SOb or wheezing.   ASSESSMENT & PLAN:    1.CAD - Prior stress with likely mild inferolateral ischemia.  - Off statin I presume due to hep C. He has ACE-I allergy.  - no recent symptoms, continue to monitor   2. HTN - restart his norvasc 5mg  daily, bp is elevated, appears it expired and he never refilled      COVID-19 Education: The signs and symptoms of COVID-19 were discussed with the patient  and how to seek care for testing (follow up with PCP or arrange E-visit).  The importance of social distancing was discussed today.  Time:   Today, I have spent 15 minutes with the patient with telehealth technology discussing the above problems.     Medication Adjustments/Labs and Tests Ordered: Current medicines are reviewed at length with the patient today.  Concerns regarding medicines are outlined above.   Tests Ordered: No orders of the defined types were placed in this encounter.   Medication Changes: No orders of the defined types were placed in this encounter.   Follow Up:  In Person in 1 year(s)  Signed, Carlyle Dolly, MD  03/04/2019 10:45 AM    [place]

## 2019-03-04 NOTE — Addendum Note (Signed)
Addended by: Levonne Hubert on: 03/04/2019 11:27 AM   Modules accepted: Orders

## 2019-04-08 ENCOUNTER — Telehealth: Payer: Self-pay | Admitting: Gastroenterology

## 2019-04-08 NOTE — Telephone Encounter (Signed)
Please let patient know that we received copy of his colonoscopy report from 12/08/2018 at Dr. Karlyn Agee.  Prep was excellent.  He had severe diverticulosis throughout the entire colon.  3 to 5 mm tubular adenoma removed from ascending colon.  Next colonoscopy recommended in 5 years unless develops symptoms.  Recommend updating CBC, C met, INR.  Diagnosis history of rectal bleeding, hepatic fibrosis.

## 2019-04-12 NOTE — Telephone Encounter (Signed)
Lmom, waiting on a return call.  

## 2019-04-19 ENCOUNTER — Other Ambulatory Visit: Payer: Self-pay

## 2019-04-19 DIAGNOSIS — K625 Hemorrhage of anus and rectum: Secondary | ICD-10-CM

## 2019-04-19 DIAGNOSIS — K7400 Hepatic fibrosis, unspecified: Secondary | ICD-10-CM

## 2019-04-19 NOTE — Telephone Encounter (Signed)
Letter mailed to pt. Lab orders placed and mailed with letter.

## 2019-05-10 ENCOUNTER — Telehealth: Payer: Self-pay | Admitting: Internal Medicine

## 2019-05-10 NOTE — Telephone Encounter (Signed)
Letter mailed

## 2019-05-10 NOTE — Telephone Encounter (Signed)
Recall for ultrasound 

## 2019-05-23 ENCOUNTER — Telehealth: Payer: Self-pay | Admitting: Internal Medicine

## 2019-05-23 DIAGNOSIS — K7400 Hepatic fibrosis, unspecified: Secondary | ICD-10-CM

## 2019-05-23 DIAGNOSIS — Z8719 Personal history of other diseases of the digestive system: Secondary | ICD-10-CM

## 2019-05-23 NOTE — Telephone Encounter (Signed)
U/s scheduled for 11/5 at 8:30am, arrival 8:15am, npo midnight  Called pt and aware of appt details.

## 2019-05-23 NOTE — Telephone Encounter (Signed)
Pt received letter to call and schedule his U/S. Please call [phone]

## 2019-05-26 ENCOUNTER — Other Ambulatory Visit: Payer: Self-pay

## 2019-05-26 ENCOUNTER — Ambulatory Visit (HOSPITAL_COMMUNITY)
Admission: RE | Admit: 2019-05-26 | Discharge: 2019-05-26 | Disposition: A | Discharge status: Home / Self Care | Payer: Medicare Other | Source: Ambulatory Visit | Attending: Gastroenterology | Admitting: Gastroenterology

## 2019-05-26 DIAGNOSIS — Z8719 Personal history of other diseases of the digestive system: Secondary | ICD-10-CM | POA: Diagnosis present

## 2019-05-26 DIAGNOSIS — K7400 Hepatic fibrosis, unspecified: Secondary | ICD-10-CM | POA: Diagnosis not present

## 2019-06-07 ENCOUNTER — Telehealth: Payer: Self-pay

## 2019-06-07 NOTE — Telephone Encounter (Signed)
Received lab results from [facility] that pt had done at PCP Magda Paganini had ordered on 04/19/2019). Will place in [place] box for review.

## 2019-06-10 NOTE — Telephone Encounter (Signed)
Reviewed labs from PCP dated April 28, 2019.  White blood cell count 5200, hemoglobin 12.2, hematocrit 38.2, MCV 77, platelets 295,000, BUN 10, creatinine 1.11, albumin 4.5, total bilirubin 0.4, alkaline phosphatase 111, AST 16, ALT 12, A1c 6.5.   Please request labs from May 2020 hospitalization at [facility]

## 2019-06-13 NOTE — Telephone Encounter (Signed)
Requested labs from [employer]

## 2019-06-20 NOTE — Telephone Encounter (Addendum)
No labs from 11/2018 received.   Let's have patient update CBC in 07/2019 for anemia. Full labs (CBC, CMET, PT/INR) and ruq u/s needed in 10/2019 for liver fibrosis

## 2020-01-04 ENCOUNTER — Encounter: Payer: Self-pay | Admitting: Internal Medicine

## 2020-03-21 ENCOUNTER — Other Ambulatory Visit: Payer: Self-pay

## 2020-03-21 ENCOUNTER — Ambulatory Visit (INDEPENDENT_AMBULATORY_CARE_PROVIDER_SITE_OTHER): Payer: Medicare Other | Admitting: Gastroenterology

## 2020-03-21 ENCOUNTER — Encounter: Payer: Self-pay | Admitting: Gastroenterology

## 2020-03-21 VITALS — BP 139/92 | HR 82 | Temp 97.3°F | Ht 69.0 in | Wt 190.2 lb

## 2020-03-21 DIAGNOSIS — Z8719 Personal history of other diseases of the digestive system: Secondary | ICD-10-CM | POA: Diagnosis not present

## 2020-03-21 DIAGNOSIS — K219 Gastro-esophageal reflux disease without esophagitis: Secondary | ICD-10-CM

## 2020-03-21 NOTE — Progress Notes (Signed)
Primary Care Physician: Suzan Slick, MD  Primary Gastroenterologist:  Roetta Sessions, MD   Chief Complaint  Patient presents with  . Follow-up    fu hx of hepatitis    HPI: Bryan Levy is a 69 y.o. male here for follow-up.  Last seen in August 2020.  History of hepatitis C, genotype 1a, noncirrhotic.  F2 with some F3 on elastography.  F1-F2 on FibroSure.  His viral load was over 14 million.  He completed only 4 weeks of Harvoni due to loss of insurance and lack of qualifying for patient assistance as he was COBRA eligible.  Fortunately he had SVR noted 1 year after treatment.  EGD follow-up 2018 at [facility] showed erosive reflux esophagitis, potential Mallory-Weiss tear, H. pylori gastritis on biopsy.  He was treated with amoxicillin, clarithromycin, and pantoprazole twice daily.  Confirmed eradication of H. pylori breath test in March 2020.  Colonoscopy May 2020 by Dr. Marcha Solders showed severe diverticulosis throughout the entire colon.  3 to 5 mm tubular adenoma removed from the ascending colon.  Next colonoscopy recommended in 5 years.  Clinically feeling well.  Denies any abdominal pain.  Bowel movements are regular.  No blood in the stool or melena.  Heartburn well controlled on omeprazole.  If he misses a dose he typically has some nocturnal symptoms.  No dysphagia.  No weight loss.    Current Outpatient Medications  Medication Sig Dispense Refill  . amLODipine (NORVASC) 5 MG tablet Take 1 tablet (5 mg total) by mouth daily. 90 tablet 3  . aspirin 81 MG tablet Take 81 mg by mouth daily.    . carvedilol (COREG) 6.25 MG tablet Take 6.25 mg by mouth daily.     . Cholecalciferol (VITAMIN D3 PO) Take 1 capsule by mouth once a week.     . cyanocobalamin (BL VITAMIN B-12) 500 MCG tablet Take 1,000 mcg by mouth as needed.     . Omega-3 Fatty Acids (FISH OIL PO) Take 1 capsule by mouth every other day.    Marland Kitchen omeprazole (PRILOSEC) 20 MG capsule Take 1  capsule (20 mg total) by mouth daily. Take 1 capsule (20mg  total) po mouth daily 30 mins before breakfast for heartburn. 30 capsule 5   No current facility-administered medications for this visit.    Allergies as of 03/21/2020 - Review Complete 03/21/2020  Allergen Reaction Noted  . Lisinopril Swelling 04/08/2017    ROS:  General: Negative for anorexia, weight loss, fever, chills, fatigue, weakness. ENT: Negative for hoarseness, difficulty swallowing , nasal congestion. CV: Negative for chest pain, angina, palpitations, dyspnea on exertion, peripheral edema.  Respiratory: Negative for dyspnea at rest, dyspnea on exertion, cough, sputum, wheezing.  GI: See history of present illness. GU:  Negative for dysuria, hematuria, urinary incontinence, urinary frequency, nocturnal urination.  Endo: Negative for unusual weight change.    Physical Examination:   BP (!) 139/92   Pulse 82   Temp (!) 97.3 F (36.3 C) (Oral)   Ht 5\' 9"  (1.753 m)   Wt 190 lb 3.2 oz (86.3 kg)   BMI 28.09 kg/m   General: Well-nourished, well-developed in no acute distress.  Eyes: No icterus. Mouth: masked Lungs: Clear to auscultation bilaterally.  Heart: Regular rate and rhythm, no murmurs rubs or gallops.  Abdomen: Bowel sounds are normal, nontender, nondistended, no hepatosplenomegaly or masses, no abdominal bruits or hernia , no rebound or guarding.   Extremities: No lower extremity edema. No  clubbing or deformities. Neuro: Alert and oriented x 4   Skin: Warm and dry, no jaundice.   Psych: Alert and cooperative, normal mood and affect.  Labs:  See hpi   Imaging Studies: No results found.    Impression/Plan:  Chronic hepatitis C: HCVRNA 1 year post 4 weeks apart finding was negative.  F2 with some F3 on elastography, moderate risk of fibrosis.  Due for repeat ultrasound with elastography for repeat fibrosis staging. Update labs. Recommended no etoh.   GERD: doing well on omeprazole 20mg   daily.  Anemia: follow up cbc now.

## 2020-03-21 NOTE — Patient Instructions (Addendum)
1. Continue omeprazole daily for reflux. 2. Please have labs and ultrasound done. We will contact you with results as available.

## 2020-03-22 ENCOUNTER — Encounter: Payer: Self-pay | Admitting: Gastroenterology

## 2020-03-29 ENCOUNTER — Other Ambulatory Visit: Payer: Self-pay

## 2020-03-29 ENCOUNTER — Ambulatory Visit (HOSPITAL_COMMUNITY)
Admission: RE | Admit: 2020-03-29 | Discharge: 2020-03-29 | Disposition: A | Discharge status: Home / Self Care | Payer: Medicare Other | Source: Ambulatory Visit | Attending: Gastroenterology | Admitting: Gastroenterology

## 2020-03-29 DIAGNOSIS — Z8719 Personal history of other diseases of the digestive system: Secondary | ICD-10-CM

## 2020-03-29 DIAGNOSIS — K219 Gastro-esophageal reflux disease without esophagitis: Secondary | ICD-10-CM

## 2020-04-09 NOTE — Progress Notes (Signed)
ON RECALL  °

## 2020-05-15 ENCOUNTER — Other Ambulatory Visit: Payer: Self-pay | Admitting: Cardiology

## 2020-06-08 ENCOUNTER — Telehealth: Payer: Self-pay | Admitting: Gastroenterology

## 2020-06-08 NOTE — Telephone Encounter (Signed)
Let patient know I was reviewing labs done at [facility] 03/2020  HCV RNA negative. Would consider him cured from hep C.   Tbili 0.3, AP 106, AST 14, ALT 22, INR 10.5, WBC 5900, Hgb 14.5 Plate [id number]  We will see him back next year 03/2021.

## 2020-06-11 NOTE — Telephone Encounter (Signed)
Spoke with pt. Pt was notified that lab results were reviewed and being cured from HEP C. Pt will follow up next year.

## 2020-10-16 ENCOUNTER — Ambulatory Visit (INDEPENDENT_AMBULATORY_CARE_PROVIDER_SITE_OTHER): Payer: Medicare Other | Admitting: Cardiology

## 2020-10-16 ENCOUNTER — Other Ambulatory Visit: Payer: Self-pay

## 2020-10-16 ENCOUNTER — Encounter: Payer: Self-pay | Admitting: Cardiology

## 2020-10-16 VITALS — BP 132/80 | HR 72 | Ht 69.0 in | Wt 203.4 lb

## 2020-10-16 DIAGNOSIS — I10 Essential (primary) hypertension: Secondary | ICD-10-CM | POA: Diagnosis not present

## 2020-10-16 DIAGNOSIS — I2510 Atherosclerotic heart disease of native coronary artery without angina pectoris: Secondary | ICD-10-CM

## 2020-10-16 MED ORDER — AMLODIPINE BESYLATE 5 MG PO TABS: 5.0000 mg | ORAL_TABLET | Freq: Every day | ORAL | 3 refills | Status: DC

## 2020-10-16 MED ORDER — PRAVASTATIN SODIUM 20 MG PO TABS: 20.0000 mg | ORAL_TABLET | Freq: Every evening | ORAL | 3 refills | Status: DC

## 2020-10-16 NOTE — Patient Instructions (Addendum)
Medication Instructions:   Your physician has recommended you make the following change in your medication:    Start pravastatin 20 mg by mouth daily at bedtime  Continue other medications the same  Refill sent for amlodipine  Labwork:  none  Testing/Procedures:  none  Follow-Up:  Your physician recommends that you schedule a follow-up appointment in: 1 year. You will receive a reminder letter in the mail in about 10 months reminding you to call and schedule your appointment. If you don't receive this letter, please contact our office.  Any Other Special Instructions Will Be Listed Below (If Applicable).  If you need a refill on your cardiac medications before your next appointment, please call your pharmacy.

## 2020-10-16 NOTE — Progress Notes (Signed)
Clinical Summary Bryan Levy is a 70 y.o.male seen today for follow up of the following medical problems.  1. CAD - NSTEMI 03/2010, normal coronaries at that time. D-dimer negative. - 02/2017 nuclear stressindependently reviewed, mild inferolateral ischemia.Evaluation limted by adjacent gut uptake. Degree of ischemia if present would still represent low risk.  02/2017 echo: LVEF 55-60%, grade I diastolic dysfunction, no WMAs - 06/2020 CT chest CAD noted   -no recent chest pain - no SOB/DOE - compliant with meds   2. HTN  Ran out of norvasc 3-4 months ago   3. AAA screen - 08/2016 CT abd no AAA.  4. Hep C - followed by GI - completed treatment   5. History of GI bleed - followed by GI. From notes history of erosive reflux esophagitis   6. DM2 - HgbA1c was 6.6 by 05/2020 - diet controlled, followed by pcp        Past Medical History:  Diagnosis Date  . Ankle dislocation, left, initial encounter 09/12/2016  . Chest pain, unspecified   . Closed posterior dislocation of left hip (HCC) 09/12/2016  . GERD (gastroesophageal reflux disease)   . Heart attack (HCC) 2008  . Hyperlipidemia   . Hypertension   . Intermediate coronary syndrome (HCC) 04/23/2017   pt believes related to panic attack  . Personal history of tobacco use, presenting hazards to health   . Polysubstance abuse (HCC)   . Shortness of breath      Allergies  Allergen Reactions  . Lisinopril Swelling     Current Outpatient Medications  Medication Sig Dispense Refill  . amLODipine (NORVASC) 5 MG tablet Take 1 tablet by mouth once daily 90 tablet 0  . aspirin 81 MG tablet Take 81 mg by mouth daily.    . carvedilol (COREG) 6.25 MG tablet Take 6.25 mg by mouth daily.     . Cholecalciferol (VITAMIN D3 PO) Take 1 capsule by mouth once a week.     . cyanocobalamin (BL VITAMIN B-12) 500 MCG tablet Take 1,000 mcg by mouth as needed.     . Omega-3 Fatty Acids (FISH OIL PO) Take 1  capsule by mouth every other day.    Marland Kitchen omeprazole (PRILOSEC) 20 MG capsule Take 1 capsule (20 mg total) by mouth daily. Take 1 capsule (20mg  total) po mouth daily 30 mins before breakfast for heartburn. 30 capsule 5   No current facility-administered medications for this visit.     Past Surgical History:  Procedure Laterality Date  . COLONOSCOPY  11/2018   Dr. 12/2018: severe diverticulosis with associated muscular hypertrophy. sessile polyp 3-27mm removed from ascending colon (tubular adenoma). next TCS in five year.   . IRRIGATION AND DEBRIDEMENT KNEE Left 09/11/2016   Procedure: IRRIGATION AND DEBRIDEMENT LEFT KNEE;  Surgeon: 09/13/2016, MD;  Location: [facility] OR;  Service: Orthopedics;  Laterality: Left;  . ORIF ACETABULAR FRACTURE Left 09/11/2016   Procedure: OPEN REDUCTION INTERNAL FIXATION (ORIF) ACETABULAR FRACTURE;  Surgeon: 09/13/2016, MD;  Location: [facility] OR;  Service: Orthopedics;  Laterality: Left;  . ORIF ANKLE FRACTURE Left 09/11/2016   Procedure: OPEN REDUCTION INTERNAL FIXATION (ORIF) LEFT ANKLE FRACTURE;  Surgeon: 09/13/2016, MD;  Location: [facility] OR;  Service: Orthopedics;  Laterality: Left;  . TONSILLECTOMY    . TOTAL HIP ARTHROPLASTY Left 05/04/2017   Procedure: LEFT TOTAL HIP ARTHROPLASTY ANTERIOR APPROACH;  Surgeon: 05/06/2017, MD;  Location: [facility] OR;  Service: Orthopedics;  Laterality: Left;     Allergies  Allergen  Reactions  . Lisinopril Swelling      Family History  Problem Relation Age of Onset  . Hypertension Mother   . Hypertension Sister   . Coronary artery disease Other        NEGATIVE      Social History Bryan Levy reports that he has quit smoking. His smoking use included cigarettes. He has a 48.00 pack-year smoking history. He has never used smokeless tobacco. Bryan Levy reports previous alcohol use.   Review of Systems CONSTITUTIONAL: No weight loss, fever, chills, weakness or fatigue.  HEENT: Eyes: No visual loss, blurred vision, double vision  or yellow sclerae.No hearing loss, sneezing, congestion, runny nose or sore throat.  SKIN: No rash or itching.  CARDIOVASCULAR: per hpi RESPIRATORY: No shortness of breath, cough or sputum.  GASTROINTESTINAL: No anorexia, nausea, vomiting or diarrhea. No abdominal pain or blood.  GENITOURINARY: No burning on urination, no polyuria NEUROLOGICAL: No headache, dizziness, syncope, paralysis, ataxia, numbness or tingling in the extremities. No change in bowel or bladder control.  MUSCULOSKELETAL: No muscle, back pain, joint pain or stiffness.  LYMPHATICS: No enlarged nodes. No history of splenectomy.  PSYCHIATRIC: No history of depression or anxiety.  ENDOCRINOLOGIC: No reports of sweating, cold or heat intolerance. No polyuria or polydipsia.  Marland Kitchen   Physical Examination Today's Vitals   10/16/20 1337  BP: 132/80  Pulse: 72  SpO2: 94%  Weight: 203 lb 6.4 oz (92.3 kg)  Height: 5\' 9"  (1.753 m)   Body mass index is 30.04 kg/m.  Gen: resting comfortably, no acute distress HEENT: no scleral icterus, pupils equal round and reactive, no palptable cervical adenopathy,  CV: RRR, no m/r/g, no jvd Resp: Clear to auscultation bilaterally GI: abdomen is soft, non-tender, non-distended, normal bowel sounds, no hepatosplenomegaly MSK: extremities are warm, no edema.  Skin: warm, no rash Neuro:  no focal deficits Psych: appropriate affect   Diagnostic Studies 03/2010 cath Nonobstructive CAD  02/2017 nuclear stress  Inferior/inferolateral defect that improves incompletely consistent with ischemia in inferolateral wall (base, minimally mid) and scar. Coexistent soft tissue attenuation (diaphragm, overlying bowel activity) makes evaluation difficult  This is a high risk study.  Nuclear stress EF: 38%.  02/2017 echo Study Conclusions  - Left ventricle: The cavity size was normal. Wall thickness was normal. Systolic function was normal. The estimated ejection fraction was in the range of  55% to 60%. Wall motion was normal; there were no regional wall motion abnormalities. Doppler parameters are consistent with abnormal left ventricular relaxation (grade 1 diastolic dysfunction). - Aortic valve: Valve area (VTI): 2.74 cm^2. Valve area (Vmax): 2.42 cm^2. Valve area (Vmean): 2.36 cm^2. - Technically adequate study.    Assessment and Plan  1.CAD - Prior stress with likely mild inferolateral ischemia.  - CT chest showed evidence of CAD - should be on statin even though his LDL has not been significantly elevated, start pravastatin 20mg  daily.  _ EKG shows SR, no acute ischemic changes  2. HTN - we will refill his norvasc  3. DM2 - HgbA1c is 6.6, followed by pcp - should be on statin, start pravastatin 20mg  daily for now      03/2017, M.D

## 2021-03-14 ENCOUNTER — Encounter: Payer: Self-pay | Admitting: Internal Medicine

## 2021-07-17 ENCOUNTER — Encounter: Payer: Self-pay | Admitting: Internal Medicine

## 2021-07-23 ENCOUNTER — Other Ambulatory Visit: Payer: Self-pay | Admitting: *Deleted

## 2021-07-23 MED ORDER — AMLODIPINE BESYLATE 5 MG PO TABS: 5.0000 mg | ORAL_TABLET | Freq: Every day | ORAL | 1 refills | Status: DC

## 2021-07-23 MED ORDER — PRAVASTATIN SODIUM 20 MG PO TABS: 20.0000 mg | ORAL_TABLET | Freq: Every evening | ORAL | 1 refills | Status: DC

## 2021-08-07 ENCOUNTER — Ambulatory Visit: Payer: Medicare Other | Admitting: Gastroenterology

## 2021-09-23 ENCOUNTER — Other Ambulatory Visit: Payer: Self-pay

## 2021-09-23 ENCOUNTER — Encounter: Payer: Self-pay | Admitting: Nurse Practitioner

## 2021-09-23 ENCOUNTER — Ambulatory Visit (INDEPENDENT_AMBULATORY_CARE_PROVIDER_SITE_OTHER): Payer: Medicare Other | Admitting: Nurse Practitioner

## 2021-09-23 VITALS — BP 159/87 | HR 83 | Ht 69.0 in | Wt 194.0 lb

## 2021-09-23 DIAGNOSIS — E119 Type 2 diabetes mellitus without complications: Secondary | ICD-10-CM | POA: Diagnosis not present

## 2021-09-23 LAB — POCT GLYCOSYLATED HEMOGLOBIN (HGB A1C): HbA1c POC (<> result, manual entry): 7.4 % (ref 4.0–5.6)

## 2021-09-23 MED ORDER — ONETOUCH ULTRA 2 W/DEVICE KIT: PACK | 0 refills | Status: DC

## 2021-09-23 MED ORDER — ONETOUCH ULTRA VI STRP: ORAL_STRIP | 12 refills | Status: DC

## 2021-09-23 MED ORDER — ONETOUCH DELICA LANCETS 33G MISC
2 refills | Status: DC
Start: 2021-09-23 — End: 2023-05-25

## 2021-09-23 MED ORDER — BLOOD GLUCOSE METER KIT: PACK | 0 refills | Status: DC

## 2021-09-23 NOTE — Patient Instructions (Signed)

## 2021-09-23 NOTE — Progress Notes (Signed)
Endocrinology Consult Note       09/23/2021, 2:45 PM   Subjective:    Patient ID: Bryan Levy, male    DOB: 1950/12/10.  Bryan Levy is being seen in consultation for management of currently uncontrolled symptomatic diabetes requested by  Leeanne Rio, MD.   Past Medical History:  Diagnosis Date   Ankle dislocation, left, initial encounter 09/12/2016   Chest pain, unspecified    Closed posterior dislocation of left hip ([place]) 09/12/2016   GERD (gastroesophageal reflux disease)    Heart attack ([place]) 2008   Hyperlipidemia    Hypertension    Intermediate coronary syndrome ([place]) 04/23/2017   pt believes related to panic attack   Personal history of tobacco use, presenting hazards to health    Polysubstance abuse ([place])    Shortness of breath     Past Surgical History:  Procedure Laterality Date   COLONOSCOPY  11/2018   Dr. Ladona Horns: severe diverticulosis with associated muscular hypertrophy. sessile polyp 3-44m removed from ascending colon (tubular adenoma). next TCS in five year.    IRRIGATION AND DEBRIDEMENT KNEE Left 09/11/2016   Procedure: IRRIGATION AND DEBRIDEMENT LEFT KNEE;  Surgeon: MAltamese Barstow MD;  [facility]  Service: Orthopedics;  Laterality: Left;   ORIF ACETABULAR FRACTURE Left 09/11/2016   Procedure: OPEN REDUCTION INTERNAL FIXATION (ORIF) ACETABULAR FRACTURE;  Surgeon: MAltamese Federal Dam MD;  [facility]  Service: Orthopedics;  Laterality: Left;   ORIF ANKLE FRACTURE Left 09/11/2016   Procedure: OPEN REDUCTION INTERNAL FIXATION (ORIF) LEFT ANKLE FRACTURE;  Surgeon: MAltamese  MD;  [facility]  Service: Orthopedics;  Laterality: Left;   TONSILLECTOMY     TOTAL HIP ARTHROPLASTY Left 05/04/2017   Procedure: LEFT TOTAL HIP ARTHROPLASTY ANTERIOR APPROACH;  Surgeon: RFrederik Pear MD;  [facility]  Service: Orthopedics;  Laterality: Left;    Social History   Socioeconomic  History   Marital status: Married    Spouse name: Not on file   Number of children: Not on file   Years of education: Not on file   Highest education level: Not on file  Occupational History   Not on file  Tobacco Use   Smoking status: Former    Packs/day: 1.00    Years: 48.00    Pack years: 48.00    Types: Cigarettes   Smokeless tobacco: Never   Tobacco comments:    one pack every day and a half  Vaping Use   Vaping Use: Never used  Substance and Sexual Activity   Alcohol use: Not Currently    Comment: pint of wine every few days; 03/01/18-quit 11/26/17   Drug use: No    Comment: Polysubstance abuse (tobacco, alcohol and cocaine) Denies cocaine use in greater than 5 years (04/22/2017)   Sexual activity: Not on file  Other Topics Concern   Not on file  Social History Narrative   ** Merged History Encounter **       Polysubstance abuse (tobacco, alcohol and cocaine) In the past   Social Determinants of Health   Financial Resource Strain: Not on file  Food Insecurity: Not on file  Transportation Needs: Not on file  Physical Activity: Not  on file  Stress: Not on file  Social Connections: Not on file    Family History  Problem Relation Age of Onset   Hypertension Mother    Hypertension Sister    Coronary artery disease Other        NEGATIVE     Outpatient Encounter Medications as of 09/23/2021  Medication Sig   baclofen (LIORESAL) 10 MG tablet Take 10 mg by mouth 3 (three) times daily.   blood glucose meter kit and supplies Dispense based on patient and insurance preference. Use up to four times daily as directed. (FOR ICD-10 E10.9, E11.9).   amLODipine (NORVASC) 5 MG tablet Take 1 tablet (5 mg total) by mouth daily.   aspirin 81 MG tablet Take 81 mg by mouth daily.   carvedilol (COREG) 6.25 MG tablet Take 6.25 mg by mouth daily.    glipiZIDE (GLUCOTROL) 5 MG tablet Take 5 mg by mouth daily.   meloxicam (MOBIC) 7.5 MG tablet Take 15 mg by mouth daily.   Multiple  Vitamin (MULTIVITAMIN) tablet Take 1 tablet by mouth daily.   Omega-3 Fatty Acids (FISH OIL PO) Take 1 capsule by mouth every other day.   omeprazole (PRILOSEC) 20 MG capsule Take 1 capsule (20 mg total) by mouth daily. Take 1 capsule (64m total) po mouth daily 30 mins before breakfast for heartburn.   pravastatin (PRAVACHOL) 20 MG tablet Take 1 tablet (20 mg total) by mouth every evening.   SYMBICORT 160-4.5 MCG/ACT inhaler in the morning and at bedtime.   vitamin B-12 (CYANOCOBALAMIN) 500 MCG tablet Take 1,000 mcg by mouth as needed.   [DISCONTINUED] Cholecalciferol (VITAMIN D3 PO) Take 1 capsule by mouth once a week.    No facility-administered encounter medications on file as of 09/23/2021.    ALLERGIES: Allergies  Allergen Reactions   Lisinopril Swelling    VACCINATION STATUS: Immunization History  Administered Date(s) Administered   Moderna Sars-Covid-2 Vaccination 08/30/2019, 09/28/2019, 05/28/2020   Pneumococcal Polysaccharide-23 09/12/2016   Tdap 09/09/2016    Diabetes He presents for his initial diabetic visit. He has type 2 diabetes mellitus. Onset time: Diagnosed at approx age of 89 His disease course has been fluctuating. Hypoglycemia symptoms include nervousness/anxiousness, sweats and tremors. Associated symptoms include fatigue. Pertinent negatives for diabetes include no blurred vision, no polydipsia, no polyuria and no weight loss. There are no hypoglycemic complications. Symptoms are stable. Diabetic complications include heart disease and nephropathy. (MI in 2011) Risk factors for coronary artery disease include tobacco exposure, stress, sedentary lifestyle, male sex, hypertension, dyslipidemia, diabetes mellitus and family history. Current diabetic treatment includes oral agent (monotherapy). He is compliant with treatment most of the time. His weight is fluctuating minimally. He is following a generally healthy diet. When asked about meal planning, he reported none. He  has not had a previous visit with a dietitian. He rarely participates in exercise. (He presents today for his consultation with no meter or logs to review.  He has never checked his glucose at home as he does not have a meter.  His POCT A1c today is 7.4%, increasing slightly from last reading which was around 7% ([place]  He drinks mostly water and some fruit juices, skips breakfast most days, eats lunch, supper and snacks in between.  He does not engage in routine physical exercise as he has had L hip replacement and having back and R hip problems now.  He is UTD on eye exam, has never seen podiatrist in  the past.) An ACE inhibitor/angiotensin II receptor blocker is not being taken. He does not see a podiatrist.Eye exam is current.  Hypertension This is a chronic problem. The current episode started more than 1 year ago. The problem is unchanged. The problem is uncontrolled. Associated symptoms include sweats. Pertinent negatives include no blurred vision. There are no associated agents to hypertension. Risk factors for coronary artery disease include diabetes mellitus, dyslipidemia, family history, male gender, sedentary lifestyle and smoking/tobacco exposure. Past treatments include calcium channel blockers and beta blockers. The current treatment provides mild improvement. Compliance problems include exercise.  Hypertensive end-organ damage includes CAD/MI. Identifiable causes of hypertension include chronic renal disease.  Hyperlipidemia This is a chronic problem. The current episode started more than 1 year ago. The problem is controlled. Recent lipid tests were reviewed and are normal. Exacerbating diseases include chronic renal disease and diabetes. Factors aggravating his hyperlipidemia include smoking, fatty foods and beta blockers. Current antihyperlipidemic treatment includes statins. The current treatment provides mild improvement of lipids. Compliance  problems include adherence to exercise.  Risk factors for coronary artery disease include diabetes mellitus, dyslipidemia, family history, male sex, hypertension and a sedentary lifestyle.    Review of systems  Constitutional: + Minimally fluctuating body weight, current Body mass index is 28.65 kg/m., no fatigue, no subjective hyperthermia, no subjective hypothermia Eyes: no blurry vision, no xerophthalmia ENT: no sore throat, no nodules palpated in throat, no dysphagia/odynophagia, no hoarseness Cardiovascular: no chest pain, no shortness of breath, no palpitations, no leg swelling Respiratory: no cough, no shortness of breath Gastrointestinal: no nausea/vomiting/diarrhea Musculoskeletal: no muscle/joint aches Skin: no rashes, no hyperemia Neurological: no tremors, no numbness, no tingling, no dizziness Psychiatric: no depression, no anxiety  Objective:     BP (!) 159/87    Pulse 83    Ht 5' 9"  (1.753 m)    Wt 194 lb (88 kg)    BMI 28.65 kg/m   Wt Readings from Last 3 Encounters:  09/23/21 194 lb (88 kg)  10/16/20 203 lb 6.4 oz (92.3 kg)  03/21/20 190 lb 3.2 oz (86.3 kg)     BP Readings from Last 3 Encounters:  09/23/21 (!) 159/87  10/16/20 132/80  03/21/20 (!) 139/92     Physical Exam- Limited  Constitutional:  Body mass index is 28.65 kg/m. , not in acute distress, normal state of mind Eyes:  EOMI, no exophthalmos Neck: Supple Cardiovascular: RRR, no murmurs, rubs, or gallops, no edema Respiratory: Adequate breathing efforts, no crackles, rales, rhonchi, or wheezing Musculoskeletal: no gross deformities, strength intact in all four extremities, no gross restriction of joint movements Skin:  no rashes, no hyperemia Neurological: no tremor with outstretched hands    CMP ( most recent) CMP     Component Value Date/Time   NA 138 10/21/2017 0825   K 4.1 10/21/2017 0825   CL 102 10/21/2017 0825   CO2 28 10/21/2017 0825   GLUCOSE 139 (H) 10/21/2017 0825   BUN 14  10/21/2017 0825   CREATININE 1.23 10/21/2017 0825   CALCIUM 9.6 10/21/2017 0825   PROT 7.3 10/21/2017 0825   ALBUMIN 3.9 10/21/2017 0825   AST 17 10/21/2017 0825   ALT 14 (L) 10/21/2017 0825   ALKPHOS 98 10/21/2017 0825   BILITOT 0.9 10/21/2017 0825   GFRNONAA >60 10/21/2017 0825   GFRAA >60 10/21/2017 0825     Diabetic Labs (most recent): Lab Results  Component Value Date   HGBA1C 7.4 09/23/2021     Lipid Panel (  most recent) Lipid Panel     Component Value Date/Time   CHOL  04/13/2010 0445    121        ATP III CLASSIFICATION:  <200     mg/dL   Desirable  200-239  mg/dL   Borderline High  >=240    mg/dL   High          TRIG 90 04/13/2010 0445   HDL 29 (L) 04/13/2010 0445   CHOLHDL 4.2 04/13/2010 0445   VLDL 18 04/13/2010 0445   LDLCALC  04/13/2010 0445    74        Total Cholesterol/HDL:CHD Risk Coronary Heart Disease Risk Table                     Men   Women  1/2 Average Risk   3.4   3.3  Average Risk       5.0   4.4  2 X Average Risk   9.6   7.1  3 X Average Risk  23.4   11.0        Use the calculated Patient Ratio above and the CHD Risk Table to determine the patient's CHD Risk.        ATP III CLASSIFICATION (LDL):  <100     mg/dL   Optimal  100-129  mg/dL   Near or Above                    Optimal  130-159  mg/dL   Borderline  160-189  mg/dL   High  >190     mg/dL   Very High      No results found for: TSH, FREET4         Assessment & Plan:   1) Type 2 Diabetes without complications  He presents today for his consultation with no meter or logs to review.  He has never checked his glucose at home as he does not have a meter.  His POCT A1c today is 7.4%, increasing slightly from last reading which was around 7% (checked by [place] from his insurance company).  He drinks mostly water and some fruit juices, skips breakfast most days, eats lunch, supper and snacks in between.  He does not engage in routine physical exercise as he has had L hip  replacement and having back and R hip problems now.  He is UTD on eye exam, has never seen podiatrist in the past.  - Bryan Levy has currently uncontrolled symptomatic type 2 DM since 71 years of age, with most recent A1c of 7.4 %.   -Recent labs reviewed.  - I had a long discussion with him about the progressive nature of diabetes and the pathology behind its complications. -his diabetes is complicated by MI and mild CKD and he remains at a high risk for more acute and chronic complications which include CAD, CVA, CKD, retinopathy, and neuropathy. These are all discussed in detail with him.  - I have counseled him on diet and weight management by adopting a carbohydrate restricted/protein rich diet. Patient is encouraged to switch to unprocessed or minimally processed complex starch and increased protein intake (animal or plant source), fruits, and vegetables. -  he is advised to stick to a routine mealtimes to eat 3 meals a day and avoid unnecessary snacks (to snack only to correct hypoglycemia).   - he acknowledges that there is a room for improvement in his food and drink choices. -  Suggestion is made for him to avoid simple carbohydrates from his diet including Cakes, Sweet Desserts, Ice Cream, Soda (diet and regular), Sweet Tea, Candies, Chips, Cookies, Store Bought Juices, Alcohol in Excess of 1-2 drinks a day, Artificial Sweeteners, Coffee Creamer, and "Sugar-free" Products. This will help patient to have more stable blood glucose profile and potentially avoid unintended weight gain.  - he will be scheduled with Jearld Fenton, RDN, CDE for diabetes education.  - I have approached him with the following individualized plan to manage his diabetes and patient agrees:   -He is advised to continue Glipizide 5 mg po daily with breakfast.  -he is encouraged to start monitoring glucose 4 times daily, before meals and before bed, to log their readings on the clinic sheets provided, and bring  them to review at follow up appointment in 2 weeks.  - Adjustment parameters are given to him for hypo and hyperglycemia in writing. - he is encouraged to call clinic for blood glucose levels less than 70 or above 300 mg /dl.  - he will be considered for incretin therapy as appropriate next visit.  - Specific targets for  A1c; LDL, HDL, and Triglycerides were discussed with the patient.  2) Blood Pressure /Hypertension:  his blood pressure is not controlled to target.   he is advised to continue his current medications including Amlodipine 5 mg p.o. daily with breakfast and Coreg 6.25 mg po daily.  3) Lipids/Hyperlipidemia:    There is no recent lipid panel to review.  He is advised to continue Pravastatin 20 mg po daily at bedtime.  Side effects and precautions discussed with him.   4)  Weight/Diet:  his Body mass index is 28.65 kg/m.  -  he is NOT a candidate for weight loss.  Exercise, and detailed carbohydrates information provided  -  detailed on discharge instructions.  5) Chronic Care/Health Maintenance: -he is not on ACEI/ARB and is on Statin medications and is encouraged to initiate and continue to follow up with Ophthalmology, Dentist, Podiatrist at least yearly or according to recommendations, and advised to [place]. I have recommended yearly flu vaccine and pneumonia vaccine at least every 5 years; moderate intensity exercise for up to 150 minutes weekly; and sleep for at least 7 hours a day.  - he is advised to maintain close follow up with Leeanne Rio, MD for primary care needs, as well as his other providers for optimal and coordinated care.   - Time spent in this patient care: 60 min, of which > 50% was spent in counseling him about his diabetes and the rest reviewing his blood glucose logs, discussing his hypoglycemia and hyperglycemia episodes, reviewing his current and previous labs/studies (including abstraction from other facilities) and medications doses and  developing a long term treatment plan based on the latest standards of care/guidelines; and documenting his care.    Please refer to Patient Instructions for Blood Glucose Monitoring and Insulin/Medications Dosing Guide" in media tab for additional information. Please also refer to "Patient Self Inventory" in the Media tab for reviewed elements of pertinent patient history.  Manson Passey participated in the discussions, expressed understanding, and voiced agreement with the above plans.  All questions were answered to his satisfaction. he is encouraged to contact clinic should he have any questions or concerns prior to his return visit.     Follow up plan: - Return in about 2 weeks (around 10/07/2021) for Diabetes F/U, Bring meter and logs.  Rayetta Pigg, [facility] [address] [place], [place] [address] Phone: [phone] Fax: [phone]  09/23/2021, 2:45 PM

## 2021-10-07 ENCOUNTER — Ambulatory Visit: Payer: Medicare Other

## 2021-10-07 ENCOUNTER — Ambulatory Visit: Payer: Medicare Other | Admitting: Nurse Practitioner

## 2021-10-07 ENCOUNTER — Other Ambulatory Visit: Payer: Self-pay

## 2021-10-07 MED ORDER — FREESTYLE LIBRE 2 SENSOR MISC: 1.0000 | Freq: Four times a day (QID) | 2 refills | Status: DC

## 2021-10-07 MED ORDER — FREESTYLE LIBRE 2 READER DEVI: 1.0000 | Freq: Four times a day (QID) | 0 refills | Status: DC

## 2021-10-07 NOTE — Patient Instructions (Incomplete)

## 2021-10-07 NOTE — Telephone Encounter (Signed)
Called pt to let him know that we called in the [employer] reader and sensors. Notified pt that this may not be covered like discussed but if by chance it is to call and come back to the office to get sensor placed on arm. Pt states that he cannot/will not do finger sticks. Also told him that if we see that this is not covered we will call him and discuss next steps.  ? ?Reader sent in due to pts phone is not compatible with the [facility] app.  ?

## 2021-10-10 ENCOUNTER — Telehealth: Payer: Self-pay

## 2021-10-10 NOTE — Telephone Encounter (Signed)
Started prior authorization for the [employer]. ?Key# [id number] ?

## 2021-10-11 NOTE — Telephone Encounter (Signed)
Just let the patient know that we could not get it approved and he will need to perform traditional fingersticks.

## 2021-10-11 NOTE — Telephone Encounter (Signed)
Received a fax that [employer] Denied the [facility] Reader for this patient. ? ?Please advise. ?

## 2021-10-11 NOTE — Telephone Encounter (Signed)
Called the patient and let him know and he stated that he is terrified of blood and is not able to prick his finger like he should. Patient stated that he is going to wait to see if his insurance plan can offer more options and he will go to [facility] to check to see how much the [facility] Reader will cost him out of pocket. ?

## 2021-10-16 ENCOUNTER — Other Ambulatory Visit: Payer: Self-pay

## 2021-10-16 MED ORDER — FREESTYLE LIBRE 2 READER DEVI
0 refills | Status: DC
Start: 2021-10-16 — End: 2021-11-06

## 2021-10-16 MED ORDER — FREESTYLE LIBRE 2 SENSOR MISC: 1 refills | Status: DC

## 2021-10-31 ENCOUNTER — Ambulatory Visit: Payer: Medicare Other | Admitting: Nutrition

## 2021-11-06 ENCOUNTER — Encounter: Payer: Self-pay | Admitting: Gastroenterology

## 2021-11-06 ENCOUNTER — Ambulatory Visit (INDEPENDENT_AMBULATORY_CARE_PROVIDER_SITE_OTHER): Payer: Medicare Other | Admitting: Gastroenterology

## 2021-11-06 VITALS — BP 132/78 | HR 80 | Temp 97.7°F | Ht 69.0 in | Wt 194.0 lb

## 2021-11-06 DIAGNOSIS — Z8619 Personal history of other infectious and parasitic diseases: Secondary | ICD-10-CM

## 2021-11-06 DIAGNOSIS — K219 Gastro-esophageal reflux disease without esophagitis: Secondary | ICD-10-CM | POA: Diagnosis not present

## 2021-11-06 NOTE — Progress Notes (Addendum)
? ? ? ? ? ?Gastroenterology Office Note   ? ? ?Primary Care Physician:  Leeanne Rio, MD  ?Primary Gastroenterologist: Dr. Gala Romney  ? ? ?Chief Complaint  ? ?Chief Complaint  ?Patient presents with  ? Follow-up  ? ? ? ?History of Present Illness  ? ?Bryan Levy is a 71 y.o. male presenting today in follow-up with a history of hepatitis C, genotype 1a, noncirrhotic.  F2 with some F3 on elastography.  F1-F2 on FibroSure.  His viral load was over 14 million.  He completed only 4 weeks of Harvoni due to loss of insurance and lack of qualifying for patient assistance as he was COBRA eligible.  Fortunately he had SVR noted 1 year after treatment. No significant fibrosis on elastography following treatment in 2021. Colonoscopy surveillance due in 2025 due to history of adenomas.  ?  ?Omeprazole daily. Controls symptoms. No dysphagia. No abdominal pain. No N/V. No abdominal pain, N/V, changes in bowel habits, constipation, diarrhea, overt GI bleeding, dysphagia, unexplained weight loss, lack of appetite, unexplained weight gain.  ? ? ? ?Past Medical History:  ?Diagnosis Date  ? Ankle dislocation, left, initial encounter 09/12/2016  ? Chest pain, unspecified   ? Closed posterior dislocation of left hip ([place]) 09/12/2016  ? GERD (gastroesophageal reflux disease)   ? Heart attack [facility]) 2008  ? Hyperlipidemia   ? Hypertension   ? Intermediate coronary syndrome ([place]) 04/23/2017  ? pt believes related to panic attack  ? Personal history of tobacco use, presenting hazards to health   ? Polysubstance abuse ([place])   ? Shortness of breath   ? ? ?Past Surgical History:  ?Procedure Laterality Date  ? COLONOSCOPY  11/2018  ? Dr. Ladona Horns: severe diverticulosis with associated muscular hypertrophy. sessile polyp 3-53m removed from ascending colon (tubular adenoma). next TCS in five year.   ? ESOPHAGOGASTRODUODENOSCOPY  2018  ? [facility]: erosive reflux esophagitis, potential MW tear, H.pylori gastritis. Eradication documented per  breath test in 2020  ? IRRIGATION AND DEBRIDEMENT KNEE Left 09/11/2016  ? Procedure: IRRIGATION AND DEBRIDEMENT LEFT KNEE;  Surgeon: MAltamese [place] MD;  Location: [facility]  Service: Orthopedics;  Laterality: Left;  ? ORIF ACETABULAR FRACTURE Left 09/11/2016  ? Procedure: OPEN REDUCTION INTERNAL FIXATION (ORIF) ACETABULAR FRACTURE;  Surgeon: MAltamese [place] MD;  Location: [facility]  Service: Orthopedics;  Laterality: Left;  ? ORIF ANKLE FRACTURE Left 09/11/2016  ? Procedure: OPEN REDUCTION INTERNAL FIXATION (ORIF) LEFT ANKLE FRACTURE;  Surgeon: MAltamese [place] MD;  Location: [facility]  Service: Orthopedics;  Laterality: Left;  ? TONSILLECTOMY    ? TOTAL HIP ARTHROPLASTY Left 05/04/2017  ? Procedure: LEFT TOTAL HIP ARTHROPLASTY ANTERIOR APPROACH;  Surgeon: RFrederik Pear MD;  Location: [facility]  Service: Orthopedics;  Laterality: Left;  ? ? ?Current Outpatient Medications  ?Medication Sig Dispense Refill  ? amLODipine (NORVASC) 5 MG tablet Take 1 tablet (5 mg total) by mouth daily. 90 tablet 1  ? aspirin 81 MG tablet Take 81 mg by mouth daily.    ? baclofen (LIORESAL) 10 MG tablet Take 10 mg by mouth 3 (three) times daily.    ? blood glucose meter kit and supplies Dispense based on patient and insurance preference. Use up to four times daily as directed. (FOR ICD-10 E10.9, E11.9). 1 each 0  ? Blood Glucose Monitoring Suppl (ONE TOUCH ULTRA 2) w/Device KIT Dispense based on patient and insurance preference. Use up to four times daily as directed. (FOR ICD-10 E10.9, E11.9). 1 kit 0  ?  carvedilol (COREG) 6.25 MG tablet Take 6.25 mg by mouth daily.     ? glipiZIDE (GLUCOTROL) 5 MG tablet Take 5 mg by mouth daily.    ? glucose blood (ONETOUCH ULTRA) test strip Use as instructed Dispense based on patient and insurance preference. Use up to four times daily as directed. (FOR ICD-10 E10.9, E11.9). 100 each 12  ? meloxicam (MOBIC) 7.5 MG tablet Take 15 mg by mouth daily.    ? Multiple Vitamin (MULTIVITAMIN) tablet Take 1 tablet by mouth  daily.    ? omeprazole (PRILOSEC) 20 MG capsule Take 1 capsule (20 mg total) by mouth daily. Take 1 capsule (67m total) po mouth daily 30 mins before breakfast for heartburn. 30 capsule 5  ? OneTouch Delica Lancets 397DMISC Dispense based on patient and insurance preference. Use up to four times daily as directed. (FOR ICD-10 E10.9, E11.9). 100 each 2  ? pravastatin (PRAVACHOL) 20 MG tablet Take 1 tablet (20 mg total) by mouth every evening. 90 tablet 1  ? SYMBICORT 160-4.5 MCG/ACT inhaler in the morning and at bedtime.    ? Continuous Blood Gluc Receiver (FREESTYLE LIBRE 2 READER) DEVI Use receiver to check blood glucose 4 times daily. (Patient not taking: Reported on 11/06/2021) 1 each 0  ? Continuous Blood Gluc Sensor (FREESTYLE LIBRE 2 SENSOR) MISC Use 1 sensor every 14 days. (Patient not taking: Reported on 11/06/2021) 6 each 1  ? Omega-3 Fatty Acids (FISH OIL PO) Take 1 capsule by mouth every other day. (Patient not taking: Reported on 11/06/2021)    ? vitamin B-12 (CYANOCOBALAMIN) 500 MCG tablet Take 1,000 mcg by mouth as needed. (Patient not taking: Reported on 11/06/2021)    ? ?No current facility-administered medications for this visit.  ? ? ?Allergies as of 11/06/2021 - Review Complete 11/06/2021  ?Allergen Reaction Noted  ? Lisinopril Swelling 04/08/2017  ? ? ?Family History  ?Problem Relation Age of Onset  ? Hypertension Mother   ? Hypertension Sister   ? Coronary artery disease Other   ?     NEGATIVE   ? ? ?Social History  ? ?Socioeconomic History  ? Marital status: Married  ?  Spouse name: Not on file  ? Number of children: Not on file  ? Years of education: Not on file  ? Highest education level: Not on file  ?Occupational History  ? Not on file  ?Tobacco Use  ? Smoking status: Former  ?  Packs/day: 1.00  ?  Years: 48.00  ?  Pack years: 48.00  ?  Types: Cigarettes  ? Smokeless tobacco: Never  ? Tobacco comments:  ?  one pack every day and a half  ?Vaping Use  ? Vaping Use: Never used  ?Substance and  Sexual Activity  ? Alcohol use: Not Currently  ?  Comment: pint of wine every few days; 03/01/18-quit 11/26/17  ? Drug use: No  ?  Comment: Polysubstance abuse (tobacco, alcohol and cocaine) Denies cocaine use in greater than 5 years (04/22/2017)  ? Sexual activity: Not on file  ?Other Topics Concern  ? Not on file  ?Social History Narrative  ? ** Merged History Encounter **  ?    ? Polysubstance abuse (tobacco, alcohol and cocaine) In the past  ? ?Social Determinants of Health  ? ?Financial Resource Strain: Not on file  ?Food Insecurity: Not on file  ?Transportation Needs: Not on file  ?Physical Activity: Not on file  ?Stress: Not on file  ?Social Connections: Not on file  ?  Intimate Partner Violence: Not on file  ? ? ? ?Review of Systems  ? ?Gen: Denies any fever, chills, fatigue, weight loss, lack of appetite.  ?CV: Denies chest pain, heart palpitations, peripheral edema, syncope.  ?Resp: Denies shortness of breath at rest or with exertion. Denies wheezing or cough.  ?GI: see HPI ?GU : Denies urinary burning, urinary frequency, urinary hesitancy ?MS: Denies joint pain, muscle weakness, cramps, or limitation of movement.  ?Derm: Denies rash, itching, dry skin ?Psych: Denies depression, anxiety, memory loss, and confusion ?Heme: Denies bruising, bleeding, and enlarged lymph nodes. ? ? ?Physical Exam  ? ?BP 132/78   Pulse 80   Temp 97.7 ?F (36.5 ?C) (Temporal)   Ht 5' 9"  (1.753 m)   Wt 194 lb (88 kg)   BMI 28.65 kg/m?  ?General:   Alert and oriented. Pleasant and cooperative. Well-nourished and well-developed.  ?Head:  Normocephalic and atraumatic. ?Eyes:  Without icterus ?Abdomen:  +BS, soft, non-tender and non-distended. No HSM noted. No guarding or rebound. No masses appreciated.  ?Rectal:  Deferred  ?Msk:  Symmetrical without gross deformities. Normal posture. ?Extremities:  Without edema. ?Neurologic:  Alert and  oriented x4;  grossly normal neurologically. ?Skin:  Intact without significant lesions or  rashes. ?Psych:  Alert and cooperative. Normal mood and affect. ? ? ?Assessment  ? ?Bryan Levy is a 71 y.o. male presenting today in follow-up with a history of hepatitis C, genotype 1a, noncirrhotic, achieving SVR, his

## 2021-11-06 NOTE — Patient Instructions (Signed)
I am reviewing the need for yearly ultrasounds with the local liver specialist. You may not need this. I will let you know! ? ?Your next colonoscopy is in 2025! We will put you on the recall list for that. ? ?You can continue to have your refills for omeprazole from your primary care doctor.  ? ?If you have any problems with abdominal pain, nausea, vomiting, swallowing, changes in bowel habits, or bleeding, please call us! ? ?For now, we can see you as needed (Unless we need to do yearly ultrasounds). I will be in touch about that! ? ?It was a pleasure to see you today. I want to create trusting relationships with patients to provide genuine, compassionate, and quality care. I value your feedback. If you receive a survey regarding your visit,  I greatly appreciate you taking time to fill this out.  ? ?Annitta Needs, PhD, ANP-BC ?[facility]  ? ?

## 2021-11-09 ENCOUNTER — Other Ambulatory Visit: Payer: Self-pay | Admitting: Cardiology

## 2022-01-15 ENCOUNTER — Other Ambulatory Visit: Payer: Self-pay | Admitting: Cardiology

## 2022-01-31 ENCOUNTER — Encounter: Payer: Self-pay | Admitting: Cardiology

## 2022-01-31 ENCOUNTER — Ambulatory Visit (INDEPENDENT_AMBULATORY_CARE_PROVIDER_SITE_OTHER): Payer: Medicare Other | Admitting: Cardiology

## 2022-01-31 VITALS — BP 120/84 | HR 90 | Ht 69.0 in | Wt 195.2 lb

## 2022-01-31 DIAGNOSIS — E782 Mixed hyperlipidemia: Secondary | ICD-10-CM | POA: Diagnosis not present

## 2022-01-31 DIAGNOSIS — I10 Essential (primary) hypertension: Secondary | ICD-10-CM | POA: Diagnosis not present

## 2022-01-31 DIAGNOSIS — I2510 Atherosclerotic heart disease of native coronary artery without angina pectoris: Secondary | ICD-10-CM

## 2022-01-31 MED ORDER — PRAVASTATIN SODIUM 40 MG PO TABS: 40.0000 mg | ORAL_TABLET | Freq: Every evening | ORAL | 2 refills | Status: DC

## 2022-01-31 NOTE — Patient Instructions (Signed)
Medication Instructions:  Increase Pravastatin to 40mg  daily  Continue all other medications.     Labwork: none  Testing/Procedures: none  Follow-Up:  Your physician wants you to follow up in:  1 year.  You should receive a call from the office when due.  If you don't receive this call, please call our office to schedule the follow up appointment    Any Other Special Instructions Will Be Listed Below (If Applicable).   If you need a refill on your cardiac medications before your next appointment, please call your pharmacy.

## 2022-01-31 NOTE — Progress Notes (Signed)
Clinical Summary Bryan Levy is a 71 y.o.male seen today for follow up of the following medical problems.    1. CAD - NSTEMI 03/2010, normal coronaries at that time. D-dimer negative.  - 02/2017 nuclear stress independently reviewed, mild inferolateral ischemia. Evaluation limted by adjacent gut uptake. Degree of ischemia if present would still represent low risk.  02/2017 echo: LVEF 02-72%, grade I diastolic dysfunction, no WMAs - 06/2020 CT chest CAD noted      - no recent chest pain. No SOB/DOE - compliant with meds     2. HTN - complianwith meds     3. AAA screen - 08/2016 CT abd no AAA.    4. Hep C - followed by GI - completed treatment     5. History of GI bleed - followed by GI. From notes history of erosive reflux esophagitis     6. DM2 - HgbA1c was 6.6 by 05/2020 - diet controlled, followed by pcp  7. Hyperliidamia - 11/2021 TC 186 TG 89 HDL 47 LDL 123   8. COPD - compliant with inhalers       Past Medical History:  Diagnosis Date   Ankle dislocation, left, initial encounter 09/12/2016   Chest pain, unspecified    Closed posterior dislocation of left hip ([place]) 09/12/2016   GERD (gastroesophageal reflux disease)    Heart attack ([place]) 2008   Hyperlipidemia    Hypertension    Intermediate coronary syndrome ([place]) 04/23/2017   pt believes related to panic attack   Personal history of tobacco use, presenting hazards to health    Polysubstance abuse ([facility])    Shortness of breath      Allergies  Allergen Reactions   Lisinopril Swelling     Current Outpatient Medications  Medication Sig Dispense Refill   amLODipine (NORVASC) 5 MG tablet Take 1 tablet by mouth once daily 30 tablet 0   aspirin 81 MG tablet Take 81 mg by mouth daily.     baclofen (LIORESAL) 10 MG tablet Take 10 mg by mouth 3 (three) times daily.     blood glucose meter kit and supplies Dispense based on patient and insurance preference. Use up to four times daily as directed. (FOR  ICD-10 E10.9, E11.9). 1 each 0   Blood Glucose Monitoring Suppl (ONE TOUCH ULTRA 2) w/Device KIT Dispense based on patient and insurance preference. Use up to four times daily as directed. (FOR ICD-10 E10.9, E11.9). 1 kit 0   carvedilol (COREG) 6.25 MG tablet Take 6.25 mg by mouth daily.      glipiZIDE (GLUCOTROL) 5 MG tablet Take 5 mg by mouth daily.     glucose blood (ONETOUCH ULTRA) test strip Use as instructed Dispense based on patient and insurance preference. Use up to four times daily as directed. (FOR ICD-10 E10.9, E11.9). 100 each 12   meloxicam (MOBIC) 7.5 MG tablet Take 15 mg by mouth daily.     Multiple Vitamin (MULTIVITAMIN) tablet Take 1 tablet by mouth daily.     omeprazole (PRILOSEC) 20 MG capsule Take 1 capsule (20 mg total) by mouth daily. Take 1 capsule (6m total) po mouth daily 30 mins before breakfast for heartburn. 30 capsule 5   OneTouch Delica Lancets 353GMISC Dispense based on patient and insurance preference. Use up to four times daily as directed. (FOR ICD-10 E10.9, E11.9). 100 each 2   pravastatin (PRAVACHOL) 20 MG tablet TAKE 1 TABLET BY MOUTH ONCE DAILY IN THE EVENING 90 tablet 0  SYMBICORT 160-4.5 MCG/ACT inhaler in the morning and at bedtime.     No current facility-administered medications for this visit.     Past Surgical History:  Procedure Laterality Date   COLONOSCOPY  11/2018   Dr. Ladona Horns: severe diverticulosis with associated muscular hypertrophy. sessile polyp 3-61mm removed from ascending colon (tubular adenoma). next TCS in five year.    ESOPHAGOGASTRODUODENOSCOPY  2018   [facility]: erosive reflux esophagitis, potential MW tear, H.pylori gastritis. Eradication documented per breath test in [place] Left 09/11/2016   Procedure: IRRIGATION AND DEBRIDEMENT LEFT KNEE;  Surgeon: Altamese [place] Park, MD;  Location: [place];  Service: Orthopedics;  Laterality: Left;   ORIF ACETABULAR FRACTURE Left 09/11/2016   Procedure: OPEN  REDUCTION INTERNAL FIXATION (ORIF) ACETABULAR FRACTURE;  Surgeon: Altamese Catron, MD;  Location: [place];  Service: Orthopedics;  Laterality: Left;   ORIF ANKLE FRACTURE Left 09/11/2016   Procedure: OPEN REDUCTION INTERNAL FIXATION (ORIF) LEFT ANKLE FRACTURE;  Surgeon: Altamese Park, MD;  Location: [place];  Service: Orthopedics;  Laterality: Left;   TONSILLECTOMY     TOTAL HIP ARTHROPLASTY Left 05/04/2017   Procedure: LEFT TOTAL HIP ARTHROPLASTY ANTERIOR APPROACH;  Surgeon: Frederik Pear, MD;  Location: [place];  Service: Orthopedics;  Laterality: Left;     Allergies  Allergen Reactions   Lisinopril Swelling      Family History  Problem Relation Age of Onset   Hypertension Mother    Hypertension Sister    Coronary artery disease Other        NEGATIVE      Social History Bryan Levy reports that he has quit smoking. His smoking use included cigarettes. He has a 48.00 pack-year smoking history. He has never used smokeless tobacco. Bryan Levy reports that he does not currently use alcohol.   Review of Systems CONSTITUTIONAL: No weight loss, fever, chills, weakness or fatigue.  HEENT: Eyes: No visual loss, blurred vision, double vision or yellow sclerae.No hearing loss, sneezing, congestion, runny nose or sore throat.  SKIN: No rash or itching.  CARDIOVASCULAR: per hpi RESPIRATORY: No shortness of breath, cough or sputum.  GASTROINTESTINAL: No anorexia, nausea, vomiting or diarrhea. No abdominal pain or blood.  GENITOURINARY: No burning on urination, no polyuria NEUROLOGICAL: No headache, dizziness, syncope, paralysis, ataxia, numbness or tingling in the extremities. No change in bowel or bladder control.  MUSCULOSKELETAL: No muscle, back pain, joint pain or stiffness.  LYMPHATICS: No enlarged nodes. No history of splenectomy.  PSYCHIATRIC: No history of depression or anxiety.  ENDOCRINOLOGIC: No reports of sweating, cold or heat intolerance. No polyuria or polydipsia.  Marland Kitchen   Physical  Examination Today's Vitals   01/31/22 1026  BP: 120/84  Pulse: 90  SpO2: 96%  Weight: 195 lb 3.2 oz (88.5 kg)  Height: $Remove'5\' 9"'fVKaYJG$  (1.753 m)   Body mass index is 28.83 kg/m.  Gen: resting comfortably, no acute distress HEENT: no scleral icterus, pupils equal round and reactive, no palptable cervical adenopathy,  CV: RRR, no m/r/ gno jvd Resp: Clear to auscultation bilaterally GI: abdomen is soft, non-tender, non-distended, normal bowel sounds, no hepatosplenomegaly MSK: extremities are warm, no edema.  Skin: warm, no rash Neuro:  no focal deficits Psych: appropriate affect   Diagnostic Studies  03/2010 cath Nonobstructive CAD   02/2017 nuclear stress Inferior/inferolateral defect that improves incompletely consistent with ischemia in inferolateral wall (base, minimally mid) and scar. Coexistent soft tissue attenuation (diaphragm, overlying bowel activity) makes evaluation difficult This is a high risk study.  Nuclear stress EF: 38%.   02/2017 echo Study Conclusions   - Left ventricle: The cavity size was normal. Wall thickness was   normal. Systolic function was normal. The estimated ejection   fraction was in the range of 55% to 60%. Wall motion was normal;   there were no regional wall motion abnormalities. Doppler   parameters are consistent with abnormal left ventricular   relaxation (grade 1 diastolic dysfunction). - Aortic valve: Valve area (VTI): 2.74 cm^2. Valve area (Vmax):   2.42 cm^2. Valve area (Vmean): 2.36 cm^2. - Technically adequate study.   Assessment and Plan   1. CAD - Prior stress with likely mild inferolateral ischemia.  - CT chest showed evidence of CAD - no symptoms, continue current medical therapy - EKG shows SR, no acute ischemic changes   2. HTN - at goal, continue curren tmeds   3. Hyperlipidemia - LDL above goal, increase pravastatin to 61m daily   F/u 1 year.      JArnoldo Lenis M.D.
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
## 2022-05-08 ENCOUNTER — Encounter (HOSPITAL_COMMUNITY)
Admission: RE | Admit: 2022-05-08 | Discharge: 2022-05-08 | Disposition: A | Discharge status: Home / Self Care | Payer: Medicare Other | Source: Ambulatory Visit | Attending: Ophthalmology | Admitting: Ophthalmology

## 2022-05-09 NOTE — Pre-Procedure Instructions (Signed)
Attempted pre-op phone call. Phone says its not in service. Called contact person and they will have patient call back.

## 2022-05-12 ENCOUNTER — Ambulatory Visit (HOSPITAL_BASED_OUTPATIENT_CLINIC_OR_DEPARTMENT_OTHER): Payer: Medicare Other | Admitting: Anesthesiology

## 2022-05-12 ENCOUNTER — Ambulatory Visit (HOSPITAL_COMMUNITY)
Admission: RE | Admit: 2022-05-12 | Discharge: 2022-05-12 | Disposition: A | Discharge status: Home / Self Care | Payer: Medicare Other | Source: Ambulatory Visit | Attending: Ophthalmology | Admitting: Ophthalmology

## 2022-05-12 ENCOUNTER — Ambulatory Visit (HOSPITAL_COMMUNITY): Payer: Medicare Other | Admitting: Anesthesiology

## 2022-05-12 ENCOUNTER — Encounter (HOSPITAL_COMMUNITY)
Admission: RE | Disposition: A | Discharge status: Home / Self Care | Payer: Self-pay | Source: Ambulatory Visit | Attending: Ophthalmology

## 2022-05-12 DIAGNOSIS — Z87891 Personal history of nicotine dependence: Secondary | ICD-10-CM | POA: Diagnosis not present

## 2022-05-12 DIAGNOSIS — I252 Old myocardial infarction: Secondary | ICD-10-CM | POA: Diagnosis not present

## 2022-05-12 DIAGNOSIS — H25811 Combined forms of age-related cataract, right eye: Secondary | ICD-10-CM | POA: Insufficient documentation

## 2022-05-12 DIAGNOSIS — I2510 Atherosclerotic heart disease of native coronary artery without angina pectoris: Secondary | ICD-10-CM

## 2022-05-12 DIAGNOSIS — I10 Essential (primary) hypertension: Secondary | ICD-10-CM | POA: Insufficient documentation

## 2022-05-12 DIAGNOSIS — K219 Gastro-esophageal reflux disease without esophagitis: Secondary | ICD-10-CM | POA: Diagnosis not present

## 2022-05-12 DIAGNOSIS — E1136 Type 2 diabetes mellitus with diabetic cataract: Secondary | ICD-10-CM | POA: Insufficient documentation

## 2022-05-12 HISTORY — PX: CATARACT EXTRACTION W/PHACO: SHX586

## 2022-05-12 LAB — GLUCOSE, CAPILLARY: Glucose-Capillary: 113 mg/dL — ABNORMAL HIGH (ref 70–99)

## 2022-05-12 SURGERY — PHACOEMULSIFICATION, CATARACT, WITH IOL INSERTION
Anesthesia: Monitor Anesthesia Care | Site: Eye | Laterality: Right

## 2022-05-12 MED ORDER — MOXIFLOXACIN HCL 0.5 % OP SOLN
OPHTHALMIC | Status: DC | PRN
  Administered 2022-05-12: 0.2 mL via OPHTHALMIC

## 2022-05-12 MED ORDER — MOXIFLOXACIN HCL 5 MG/ML IO SOLN
INTRAOCULAR | Status: AC
  Filled 2022-05-12: qty 1

## 2022-05-12 MED ORDER — LIDOCAINE HCL 3.5 % OP GEL
1.0000 | Freq: Once | OPHTHALMIC | Status: AC
  Administered 2022-05-12: 1 via OPHTHALMIC

## 2022-05-12 MED ORDER — SODIUM HYALURONATE 23MG/ML IO SOSY
PREFILLED_SYRINGE | INTRAOCULAR | Status: DC | PRN
  Administered 2022-05-12: 0.6 mL via INTRAOCULAR

## 2022-05-12 MED ORDER — STERILE WATER FOR IRRIGATION IR SOLN
Status: DC | PRN
  Administered 2022-05-12: 250 mL

## 2022-05-12 MED ORDER — LIDOCAINE HCL (PF) 1 % IJ SOLN
INTRAOCULAR | Status: DC | PRN
  Administered 2022-05-12: 1 mL via OPHTHALMIC

## 2022-05-12 MED ORDER — PHENYLEPHRINE HCL 2.5 % OP SOLN
1.0000 [drp] | OPHTHALMIC | Status: AC | PRN
  Administered 2022-05-12 (×3): 1 [drp] via OPHTHALMIC

## 2022-05-12 MED ORDER — SODIUM HYALURONATE 10 MG/ML IO SOLUTION
PREFILLED_SYRINGE | INTRAOCULAR | Status: DC | PRN
  Administered 2022-05-12: 0.85 mL via INTRAOCULAR

## 2022-05-12 MED ORDER — SODIUM CHLORIDE 0.9% FLUSH
INTRAVENOUS | Status: DC | PRN
  Administered 2022-05-12: 10 mL via INTRAVENOUS

## 2022-05-12 MED ORDER — BSS IO SOLN
INTRAOCULAR | Status: DC | PRN
  Administered 2022-05-12: 15 mL via INTRAOCULAR

## 2022-05-12 MED ORDER — TROPICAMIDE 1 % OP SOLN
1.0000 [drp] | OPHTHALMIC | Status: AC | PRN
  Administered 2022-05-12 (×3): 1 [drp] via OPHTHALMIC

## 2022-05-12 MED ORDER — TETRACAINE HCL 0.5 % OP SOLN
1.0000 [drp] | OPHTHALMIC | Status: AC | PRN
  Administered 2022-05-12 (×3): 1 [drp] via OPHTHALMIC

## 2022-05-12 MED ORDER — MIDAZOLAM HCL 2 MG/2ML IJ SOLN
INTRAMUSCULAR | Status: DC | PRN
  Administered 2022-05-12: 2 mg via INTRAVENOUS

## 2022-05-12 MED ORDER — POVIDONE-IODINE 5 % OP SOLN
OPHTHALMIC | Status: DC | PRN
  Administered 2022-05-12: 1 via OPHTHALMIC

## 2022-05-12 MED ORDER — EPINEPHRINE PF 1 MG/ML IJ SOLN
INTRAMUSCULAR | Status: AC
  Filled 2022-05-12: qty 2

## 2022-05-12 MED ORDER — MIDAZOLAM HCL 2 MG/2ML IJ SOLN
INTRAMUSCULAR | Status: AC
  Filled 2022-05-12: qty 2

## 2022-05-12 MED ORDER — PHENYLEPHRINE-KETOROLAC 1-0.3 % IO SOLN
INTRAOCULAR | Status: DC | PRN
  Administered 2022-05-12: 500 mL via OPHTHALMIC

## 2022-05-12 MED ORDER — PHENYLEPHRINE-KETOROLAC 1-0.3 % IO SOLN
INTRAOCULAR | Status: AC
  Filled 2022-05-12: qty 4

## 2022-05-12 SURGICAL SUPPLY — 16 items
CATARACT SUITE SIGHTPATH (MISCELLANEOUS) ×1 IMPLANT
CLOTH BEACON ORANGE TIMEOUT ST (SAFETY) ×1 IMPLANT
DRAPE HALF SHEET 40X57 (DRAPES) IMPLANT
EYE SHIELD UNIVERSAL CLEAR (GAUZE/BANDAGES/DRESSINGS) IMPLANT
FEE CATARACT SUITE SIGHTPATH (MISCELLANEOUS) ×1 IMPLANT
GLOVE BIOGEL PI IND STRL 7.0 (GLOVE) ×2 IMPLANT
GLOVE SS BIOGEL STRL SZ 6.5 (GLOVE) IMPLANT
LENS IOL RAYNER 19.0 (Intraocular Lens) ×1 IMPLANT
LENS IOL RAYONE EMV 19.0 (Intraocular Lens) IMPLANT
NDL HYPO 18GX1.5 BLUNT FILL (NEEDLE) ×1 IMPLANT
NEEDLE HYPO 18GX1.5 BLUNT FILL (NEEDLE) ×1 IMPLANT
PAD ARMBOARD 7.5X6 YLW CONV (MISCELLANEOUS) ×1 IMPLANT
SYR TB 1ML LL NO SAFETY (SYRINGE) ×1 IMPLANT
TAPE SURG TRANSPORE 1 IN (GAUZE/BANDAGES/DRESSINGS) IMPLANT
TAPE SURGICAL TRANSPORE 1 IN (GAUZE/BANDAGES/DRESSINGS) ×1
WATER STERILE IRR 250ML POUR (IV SOLUTION) ×1 IMPLANT

## 2022-05-12 NOTE — Transfer of Care (Signed)
Immediate Anesthesia Transfer of Care Note  Patient: Bryan Levy  Procedure(s) Performed: CATARACT EXTRACTION PHACO AND INTRAOCULAR LENS PLACEMENT (IOC) (Right: Eye)  Patient Location: PACU and Short Stay  Anesthesia Type:MAC  Level of Consciousness: awake, alert , oriented and patient cooperative  Airway & Oxygen Therapy: Patient Spontanous Breathing  Post-op Assessment: Report given to RN, Post -op Vital signs reviewed and stable and Patient moving all extremities X 4  Post vital signs: Reviewed and stable  Last Vitals:  Vitals Value Taken Time  BP    Temp    Pulse    Resp    SpO2      Last Pain:  Vitals:   05/12/22 1000  PainSc: 0-No pain         Complications: No notable events documented.

## 2022-05-12 NOTE — Discharge Instructions (Signed)
Please discharge patient when stable, will follow up today with Dr. Elah Avellino at the [facility] [place] office immediately following discharge.  Leave shield in place until visit.  All paperwork with discharge instructions will be given at the office.  [facility] Address:  [address]  [place], [place] [address]  

## 2022-05-12 NOTE — Anesthesia Preprocedure Evaluation (Signed)
Anesthesia Evaluation  Patient identified by MRN, date of birth, ID band Patient awake    Reviewed: Allergy & Precautions, H&P , NPO status , Patient's Chart, lab work & pertinent test results, reviewed documented beta blocker date and time   Airway Mallampati: II  TM Distance: >3 FB Neck ROM: full    Dental no notable dental hx.    Pulmonary neg pulmonary ROS, former smoker,    Pulmonary exam normal breath sounds clear to auscultation       Cardiovascular Exercise Tolerance: Good hypertension, + CAD and + Past MI   Rhythm:regular Rate:Normal     Neuro/Psych  Neuromuscular disease negative psych ROS   GI/Hepatic GERD  Medicated,(+) Hepatitis -, C  Endo/Other  negative endocrine ROS  Renal/GU negative Renal ROS  negative genitourinary   Musculoskeletal   Abdominal   Peds  Hematology negative hematology ROS (+)   Anesthesia Other Findings   Reproductive/Obstetrics negative OB ROS                             Anesthesia Physical Anesthesia Plan  ASA: 3  Anesthesia Plan: MAC   Post-op Pain Management:    Induction:   PONV Risk Score and Plan:   Airway Management Planned:   Additional Equipment:   Intra-op Plan:   Post-operative Plan:   Informed Consent: I have reviewed the patients History and Physical, chart, labs and discussed the procedure including the risks, benefits and alternatives for the proposed anesthesia with the patient or authorized representative who has indicated his/her understanding and acceptance.     Dental Advisory Given  Plan Discussed with: CRNA  Anesthesia Plan Comments:         Anesthesia Quick Evaluation

## 2022-05-12 NOTE — Anesthesia Postprocedure Evaluation (Signed)
Anesthesia Post Note  Patient: Bryan Levy  Procedure(s) Performed: CATARACT EXTRACTION PHACO AND INTRAOCULAR LENS PLACEMENT ([facility]) (Right: Eye)  Patient location during evaluation: Phase II Anesthesia Type: MAC Level of consciousness: awake Pain management: pain level controlled Vital Signs Assessment: post-procedure vital signs reviewed and stable Respiratory status: spontaneous breathing and respiratory function stable Cardiovascular status: blood pressure returned to baseline and stable Postop Assessment: no headache and no apparent nausea or vomiting Anesthetic complications: no Comments: Late entry   No notable events documented.   Last Vitals:  Vitals:   05/12/22 1002 05/12/22 1103  BP: (!) 169/88 (!) 145/95  Pulse: 81 76  Resp: (!) 21 (!) 21  Temp:  36.6 C  SpO2: 97% 98%    Last Pain:  Vitals:   05/12/22 1103  TempSrc: Oral  PainSc: 0-No pain                 [name]

## 2022-05-12 NOTE — Interval H&P Note (Signed)
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  The various methods of treatment have been discussed with the patient and family. After consideration of risks, benefits and other options for treatment, the patient has consented to  Procedure(s) with comments: CATARACT EXTRACTION PHACO AND INTRAOCULAR LENS PLACEMENT ([place]) (Right) - CDE as a surgical intervention.  The patient's history has been reviewed, patient examined, no change in status, stable for surgery.  I have reviewed the patient's chart and labs.  Questions were answered to the patient's satisfaction.     Baruch Goldmann

## 2022-05-12 NOTE — Op Note (Signed)
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 Keyontay Stolz, MD, MA  Anesthesia: MAC, Topical Akten  Complications: None  Estimated Blood Loss: <78m (minimal)  Specimens: None  Implants: As above  Indications:  Visually significant age-related cataract, Right Eye  Procedure:  The patient was seen and identified in the pre-operative area. The operative eye was identified and dilated.  The operative eye was marked.  Topical anesthesia was administered to the operative eye.     The patient was then to the operative suite and placed in the supine position.  A timeout was performed confirming the patient, procedure to be performed, and all other relevant information.   The patient's face was prepped and draped in the usual fashion for intra-ocular surgery.  A lid speculum was placed into the operative eye and the surgical microscope moved into place and focused.  A superotemporal paracentesis was created using a 20 gauge paracentesis blade.  Shugarcaine was injected into the anterior chamber.  Viscoelastic was injected into the anterior chamber.  A temporal clear-corneal main wound incision was created using a 2.487mmicrokeratome.  A continuous curvilinear capsulorrhexis was initiated using an irrigating cystitome and completed using capsulorrhexis forceps.  Hydrodissection and hydrodeliniation were performed.  Viscoelastic was injected into the anterior chamber.  A phacoemulsification handpiece and a chopper as a second instrument were used to remove the nucleus and epinucleus. The irrigation/aspiration handpiece was used to remove any  remaining cortical material.   The capsular bag was reinflated with viscoelastic, checked, and found to be intact.  The intraocular lens was inserted into the capsular bag.  The irrigation/aspiration handpiece was used to remove any remaining viscoelastic.  The clear corneal wound and paracentesis wounds were then hydrated and checked with Weck-Cels to be watertight. 0.78m82mf moxifloxacin was injected into the anterior chamber. The lid-speculum was removed.  The drape was removed.  The patient's face was cleaned with a wet and dry 4x4. A clear shield was taped over the eye. The patient was taken to the post-operative care unit in good condition, having tolerated the procedure well.  Post-Op Instructions: The [facility] post-operative evaluation and will receive all other orders and instructions.

## 2022-05-13 ENCOUNTER — Encounter (HOSPITAL_COMMUNITY): Payer: Self-pay | Admitting: Ophthalmology

## 2022-05-29 DIAGNOSIS — H25812 Combined forms of age-related cataract, left eye: Secondary | ICD-10-CM | POA: Diagnosis not present

## 2022-06-02 DIAGNOSIS — M5416 Radiculopathy, lumbar region: Secondary | ICD-10-CM | POA: Diagnosis not present

## 2022-06-02 DIAGNOSIS — M7062 Trochanteric bursitis, left hip: Secondary | ICD-10-CM | POA: Diagnosis not present

## 2022-06-03 ENCOUNTER — Encounter (HOSPITAL_COMMUNITY)
Admission: RE | Admit: 2022-06-03 | Discharge: 2022-06-03 | Disposition: A | Discharge status: Home / Self Care | Payer: Medicare Other | Source: Ambulatory Visit | Attending: Ophthalmology | Admitting: Ophthalmology
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
## 2022-06-03 NOTE — Pre-Procedure Instructions (Signed)
Attempted pre-op phone call. Left VM for message for patent to call back.

## 2022-06-04 ENCOUNTER — Encounter (HOSPITAL_COMMUNITY): Payer: Self-pay

## 2022-06-04 NOTE — Pre-Procedure Instructions (Signed)
Attempted pre-op phone call. Left VM again for patient to call us back.

## 2022-06-06 ENCOUNTER — Ambulatory Visit (HOSPITAL_BASED_OUTPATIENT_CLINIC_OR_DEPARTMENT_OTHER): Payer: Medicare Other | Admitting: Anesthesiology

## 2022-06-06 ENCOUNTER — Ambulatory Visit (HOSPITAL_COMMUNITY): Payer: Medicare Other | Admitting: Anesthesiology

## 2022-06-06 ENCOUNTER — Encounter (HOSPITAL_COMMUNITY)
Admission: RE | Disposition: A | Discharge status: Home / Self Care | Payer: Self-pay | Source: Ambulatory Visit | Attending: Ophthalmology

## 2022-06-06 ENCOUNTER — Ambulatory Visit (HOSPITAL_COMMUNITY)
Admission: RE | Admit: 2022-06-06 | Discharge: 2022-06-06 | Disposition: A | Discharge status: Home / Self Care | Payer: Medicare Other | Source: Ambulatory Visit | Attending: Ophthalmology | Admitting: Ophthalmology

## 2022-06-06 DIAGNOSIS — Z9841 Cataract extraction status, right eye: Secondary | ICD-10-CM | POA: Diagnosis not present

## 2022-06-06 DIAGNOSIS — I10 Essential (primary) hypertension: Secondary | ICD-10-CM | POA: Diagnosis not present

## 2022-06-06 DIAGNOSIS — K219 Gastro-esophageal reflux disease without esophagitis: Secondary | ICD-10-CM | POA: Insufficient documentation

## 2022-06-06 DIAGNOSIS — Z79899 Other long term (current) drug therapy: Secondary | ICD-10-CM | POA: Diagnosis not present

## 2022-06-06 DIAGNOSIS — Z9889 Other specified postprocedural states: Secondary | ICD-10-CM | POA: Insufficient documentation

## 2022-06-06 DIAGNOSIS — I252 Old myocardial infarction: Secondary | ICD-10-CM | POA: Insufficient documentation

## 2022-06-06 DIAGNOSIS — H2181 Floppy iris syndrome: Secondary | ICD-10-CM | POA: Insufficient documentation

## 2022-06-06 DIAGNOSIS — H25812 Combined forms of age-related cataract, left eye: Secondary | ICD-10-CM | POA: Insufficient documentation

## 2022-06-06 DIAGNOSIS — I2510 Atherosclerotic heart disease of native coronary artery without angina pectoris: Secondary | ICD-10-CM | POA: Insufficient documentation

## 2022-06-06 DIAGNOSIS — Z87891 Personal history of nicotine dependence: Secondary | ICD-10-CM | POA: Insufficient documentation

## 2022-06-06 HISTORY — PX: CATARACT EXTRACTION W/PHACO: SHX586

## 2022-06-06 LAB — GLUCOSE, CAPILLARY: Glucose-Capillary: 136 mg/dL — ABNORMAL HIGH (ref 70–99)

## 2022-06-06 SURGERY — PHACOEMULSIFICATION, CATARACT, WITH IOL INSERTION
Anesthesia: Monitor Anesthesia Care | Site: Eye | Laterality: Left

## 2022-06-06 MED ORDER — EPINEPHRINE PF 1 MG/ML IJ SOLN
INTRAMUSCULAR | Status: AC
  Filled 2022-06-06: qty 2

## 2022-06-06 MED ORDER — STERILE WATER FOR IRRIGATION IR SOLN
Status: DC | PRN
  Administered 2022-06-06: 25 mL

## 2022-06-06 MED ORDER — TROPICAMIDE 1 % OP SOLN
1.0000 [drp] | OPHTHALMIC | Status: AC | PRN
  Administered 2022-06-06 (×3): 1 [drp] via OPHTHALMIC

## 2022-06-06 MED ORDER — MOXIFLOXACIN HCL 5 MG/ML IO SOLN
INTRAOCULAR | Status: AC
  Filled 2022-06-06: qty 1

## 2022-06-06 MED ORDER — PHENYLEPHRINE HCL 2.5 % OP SOLN
1.0000 [drp] | OPHTHALMIC | Status: AC | PRN
  Administered 2022-06-06 (×3): 1 [drp] via OPHTHALMIC

## 2022-06-06 MED ORDER — SODIUM HYALURONATE 23MG/ML IO SOSY
PREFILLED_SYRINGE | INTRAOCULAR | Status: DC | PRN
  Administered 2022-06-06: .6 mL via INTRAOCULAR

## 2022-06-06 MED ORDER — POVIDONE-IODINE 5 % OP SOLN
OPHTHALMIC | Status: DC | PRN
  Administered 2022-06-06: 1 via OPHTHALMIC

## 2022-06-06 MED ORDER — PHENYLEPHRINE-KETOROLAC 1-0.3 % IO SOLN
INTRAOCULAR | Status: AC
  Filled 2022-06-06: qty 4

## 2022-06-06 MED ORDER — SODIUM CHLORIDE 0.9% FLUSH
INTRAVENOUS | Status: DC | PRN
  Administered 2022-06-06: 5 mL via INTRAVENOUS

## 2022-06-06 MED ORDER — MIDAZOLAM HCL 2 MG/2ML IJ SOLN
INTRAMUSCULAR | Status: AC
  Filled 2022-06-06: qty 2

## 2022-06-06 MED ORDER — TETRACAINE HCL 0.5 % OP SOLN
1.0000 [drp] | OPHTHALMIC | Status: AC | PRN
  Administered 2022-06-06 (×3): 1 [drp] via OPHTHALMIC

## 2022-06-06 MED ORDER — BSS IO SOLN
INTRAOCULAR | Status: DC | PRN
  Administered 2022-06-06: 15 mL via INTRAOCULAR

## 2022-06-06 MED ORDER — PHENYLEPHRINE-KETOROLAC 1-0.3 % IO SOLN
INTRAOCULAR | Status: DC | PRN
  Administered 2022-06-06: 500 mL via OPHTHALMIC

## 2022-06-06 MED ORDER — LIDOCAINE HCL 3.5 % OP GEL
1.0000 | Freq: Once | OPHTHALMIC | Status: AC
  Administered 2022-06-06: 1 via OPHTHALMIC

## 2022-06-06 MED ORDER — MOXIFLOXACIN HCL 0.5 % OP SOLN
OPHTHALMIC | Status: DC | PRN
  Administered 2022-06-06: .3 mL via OPHTHALMIC

## 2022-06-06 MED ORDER — MIDAZOLAM HCL 2 MG/2ML IJ SOLN
INTRAMUSCULAR | Status: DC | PRN
  Administered 2022-06-06: 2 mg via INTRAVENOUS

## 2022-06-06 MED ORDER — SODIUM HYALURONATE 10 MG/ML IO SOLUTION
PREFILLED_SYRINGE | INTRAOCULAR | Status: DC | PRN
  Administered 2022-06-06: .85 mL via INTRAOCULAR

## 2022-06-06 MED ORDER — LIDOCAINE HCL (PF) 1 % IJ SOLN
INTRAOCULAR | Status: DC | PRN
  Administered 2022-06-06: 1 mL via OPHTHALMIC

## 2022-06-06 SURGICAL SUPPLY — 14 items
CATARACT SUITE SIGHTPATH (MISCELLANEOUS) ×1 IMPLANT
CLOTH BEACON ORANGE TIMEOUT ST (SAFETY) ×1 IMPLANT
EYE SHIELD UNIVERSAL CLEAR (GAUZE/BANDAGES/DRESSINGS) IMPLANT
FEE CATARACT SUITE SIGHTPATH (MISCELLANEOUS) ×1 IMPLANT
GLOVE BIOGEL PI IND STRL 7.0 (GLOVE) ×2 IMPLANT
LENS IOL RAYNER 19.0 (Intraocular Lens) ×1 IMPLANT
LENS IOL RAYONE EMV 19.0 (Intraocular Lens) IMPLANT
NDL HYPO 18GX1.5 BLUNT FILL (NEEDLE) ×1 IMPLANT
NEEDLE HYPO 18GX1.5 BLUNT FILL (NEEDLE) ×1 IMPLANT
PAD ARMBOARD 7.5X6 YLW CONV (MISCELLANEOUS) ×1 IMPLANT
RING MALYGIN 7.0 (MISCELLANEOUS) IMPLANT
SYR TB 1ML LL NO SAFETY (SYRINGE) ×1 IMPLANT
TAPE SURG TRANSPORE 1 IN (GAUZE/BANDAGES/DRESSINGS) IMPLANT
TAPE SURGICAL TRANSPORE 1 IN (GAUZE/BANDAGES/DRESSINGS) ×1

## 2022-06-06 NOTE — Anesthesia Preprocedure Evaluation (Signed)
Anesthesia Evaluation  Patient identified by MRN, date of birth, ID band Patient awake    Reviewed: Allergy & Precautions, H&P , NPO status , Patient's Chart, lab work & pertinent test results, reviewed documented beta blocker date and time   Airway Mallampati: II  TM Distance: >3 FB Neck ROM: full    Dental no notable dental hx.    Pulmonary neg pulmonary ROS, former smoker   Pulmonary exam normal breath sounds clear to auscultation       Cardiovascular Exercise Tolerance: Good hypertension, + CAD and + Past MI   Rhythm:regular Rate:Normal     Neuro/Psych  Neuromuscular disease  negative psych ROS   GI/Hepatic ,GERD  Medicated,,(+) Hepatitis -, C  Endo/Other  negative endocrine ROS    Renal/GU negative Renal ROS  negative genitourinary   Musculoskeletal   Abdominal   Peds  Hematology negative hematology ROS (+)   Anesthesia Other Findings   Reproductive/Obstetrics negative OB ROS                              Anesthesia Physical Anesthesia Plan  ASA: 3  Anesthesia Plan: MAC   Post-op Pain Management:    Induction:   PONV Risk Score and Plan:   Airway Management Planned:   Additional Equipment:   Intra-op Plan:   Post-operative Plan:   Informed Consent: I have reviewed the patients History and Physical, chart, labs and discussed the procedure including the risks, benefits and alternatives for the proposed anesthesia with the patient or authorized representative who has indicated his/her understanding and acceptance.     Dental Advisory Given  Plan Discussed with: CRNA  Anesthesia Plan Comments:          Anesthesia Quick Evaluation

## 2022-06-06 NOTE — Discharge Instructions (Signed)
Please discharge patient when stable, will follow up today with Dr. Azad Calame at the [facility] [place] office immediately following discharge.  Leave shield in place until visit.  All paperwork with discharge instructions will be given at the office.  [facility] [place] Address:  [address]  [place], [place] [address]  

## 2022-06-06 NOTE — Interval H&P Note (Signed)
History and Physical Interval Note:  06/06/2022 9:27 AM  Bryan Levy  has presented today for surgery, with the diagnosis of nuclear sclerosis age related cataract; left.  The various methods of treatment have been discussed with the patient and family. After consideration of risks, benefits and other options for treatment, the patient has consented to  Procedure(s) with comments: CATARACT EXTRACTION PHACO AND INTRAOCULAR LENS PLACEMENT (I[facility]) (Left) - CDE: as a surgical intervention.  The patient's history has been reviewed, patient examined, no change in status, stable for surgery.  I have reviewed the patient's chart and labs.  Questions were answered to the patient's satisfaction.     Fabio Pierce

## 2022-06-06 NOTE — Transfer of Care (Signed)
Immediate Anesthesia Transfer of Care Note  Patient: Bryan Levy  Procedure(s) Performed: CATARACT EXTRACTION PHACO AND INTRAOCULAR LENS PLACEMENT ([facility]) (Left: Eye)  Patient Location: Short Stay  Anesthesia Type:MAC  Level of Consciousness: awake, alert , oriented, and patient cooperative  Airway & Oxygen Therapy: Patient Spontanous Breathing  Post-op Assessment: Report given to RN, Post -op Vital signs reviewed and stable, and Patient moving all extremities X 4  Post vital signs: Reviewed and stable  Last Vitals:  Vitals Value Taken Time  BP    Temp    Pulse    Resp    SpO2      Last Pain:  Vitals:   06/06/22 0955  TempSrc: Oral  PainSc: 0-No pain         Complications: No notable events documented.

## 2022-06-06 NOTE — Op Note (Signed)
Date of procedure: 06/06/22  Pre-operative diagnosis: Visually significant age-related combined cataract, Left Eye; Poor dilation, Left eye (H25.812)   Post-operative diagnosis: Visually significant age-related cataract, Left Eye; Intra-operative Floppy Iris Syndrome, Left Eye (H21.81)  Procedure: Complex removal of cataract via phacoemulsification and insertion of intra-ocular lens Rayner [id number] +19.0D into the capsular bag of the Left Eye (CPT [phone])  Attending surgeon: Gerda Diss. Margaret Staggs, MD, MA  Anesthesia: MAC, Topical Akten  Complications: None  Estimated Blood Loss: <83m (minimal)  Specimens: None  Implants: As above  Indications:  Visually significant cataract, Left Eye  Procedure:  The patient was seen and identified in the pre-operative area. The operative eye was identified and dilated.  The operative eye was marked.  Topical anesthesia was administered to the operative eye.     The patient was then to the operative suite and placed in the supine position.  A timeout was performed confirming the patient, procedure to be performed, and all other relevant information.   The patient's face was prepped and draped in the usual fashion for intra-ocular surgery.  A lid speculum was placed into the operative eye and the surgical microscope moved into place and focused.  Poor dilation of the iris was confirmed.  An inferotemporal paracentesis was created using a 20 gauge paracentesis blade.  Shugarcaine was injected into the anterior chamber.  Viscoelastic was injected into the anterior chamber.  A temporal clear-corneal main wound incision was created using a 2.464mmicrokeratome.  A Malyugin ring was placed.  A continuous curvilinear capsulorrhexis was initiated using an irrigating cystitome and completed using capsulorrhexis forceps.  Hydrodissection and hydrodeliniation were performed.  Viscoelastic was injected into the anterior chamber.  A phacoemulsification handpiece and a chopper as  a second instrument were used to remove the nucleus and epinucleus. The irrigation/aspiration handpiece was used to remove any remaining cortical material.   The capsular bag was reinflated with viscoelastic, checked, and found to be intact.  The intraocular lens was inserted into the capsular bag and dialed into place using a MaSurveyor, mineralsThe Malyugin ring was removed.  The irrigation/aspiration handpiece was used to remove any remaining viscoelastic.  The clear corneal wound and paracentesis wounds were then hydrated and checked with Weck-Cels to be watertight. 0.30m62mf moxifloxacin was injected into the anterior chamber.  The lid-speculum and drape was removed, and the patient's face was cleaned with a wet and dry 4x4. A clear shield was taped over the eye. The patient was taken to the post-operative care unit in good condition, having tolerated the procedure well.  Post-Op Instructions: [facility] post[facility]operative [facility] and will receive all other orders and instructions.

## 2022-06-06 NOTE — Anesthesia Postprocedure Evaluation (Signed)
Anesthesia Post Note  Patient: Bryan Levy  Procedure(s) Performed: CATARACT EXTRACTION PHACO AND INTRAOCULAR LENS PLACEMENT (I[facility]) (Left: Eye)  Patient location during evaluation: Phase II Anesthesia Type: MAC Level of consciousness: awake Pain management: pain level controlled Vital Signs Assessment: post-procedure vital signs reviewed and stable Respiratory status: spontaneous breathing and respiratory function stable Cardiovascular status: blood pressure returned to baseline and stable Postop Assessment: no headache and no apparent nausea or vomiting Anesthetic complications: no Comments: Late entry   No notable events documented.   Last Vitals:  Vitals:   06/06/22 0830 06/06/22 0955  BP:  (!) 165/96  Pulse:  75  Resp: 18 15  Temp:  36.8 C  SpO2:  (!) 77%    Last Pain:  Vitals:   06/06/22 0955  TempSrc: Oral  PainSc: 0-No pain                 [name]

## 2022-06-16 ENCOUNTER — Encounter (HOSPITAL_COMMUNITY): Payer: Self-pay | Admitting: Ophthalmology

## 2022-06-17 DIAGNOSIS — E1143 Type 2 diabetes mellitus with diabetic autonomic (poly)neuropathy: Secondary | ICD-10-CM | POA: Diagnosis not present

## 2022-06-17 DIAGNOSIS — E785 Hyperlipidemia, unspecified: Secondary | ICD-10-CM | POA: Diagnosis not present

## 2022-06-17 DIAGNOSIS — I10 Essential (primary) hypertension: Secondary | ICD-10-CM | POA: Diagnosis not present

## 2022-06-17 DIAGNOSIS — J449 Chronic obstructive pulmonary disease, unspecified: Secondary | ICD-10-CM | POA: Diagnosis not present

## 2022-06-17 DIAGNOSIS — K219 Gastro-esophageal reflux disease without esophagitis: Secondary | ICD-10-CM | POA: Diagnosis not present

## 2022-06-20 DIAGNOSIS — E1143 Type 2 diabetes mellitus with diabetic autonomic (poly)neuropathy: Secondary | ICD-10-CM | POA: Diagnosis not present

## 2022-06-20 DIAGNOSIS — E785 Hyperlipidemia, unspecified: Secondary | ICD-10-CM | POA: Diagnosis not present

## 2022-06-20 DIAGNOSIS — Z79899 Other long term (current) drug therapy: Secondary | ICD-10-CM | POA: Diagnosis not present

## 2022-06-28 DIAGNOSIS — I10 Essential (primary) hypertension: Secondary | ICD-10-CM | POA: Diagnosis not present

## 2022-06-28 DIAGNOSIS — Z79899 Other long term (current) drug therapy: Secondary | ICD-10-CM | POA: Diagnosis not present

## 2022-06-28 DIAGNOSIS — Z87891 Personal history of nicotine dependence: Secondary | ICD-10-CM | POA: Diagnosis not present

## 2022-06-28 DIAGNOSIS — R0602 Shortness of breath: Secondary | ICD-10-CM | POA: Diagnosis not present

## 2022-06-28 DIAGNOSIS — E119 Type 2 diabetes mellitus without complications: Secondary | ICD-10-CM | POA: Diagnosis not present

## 2022-06-28 DIAGNOSIS — R42 Dizziness and giddiness: Secondary | ICD-10-CM | POA: Diagnosis not present

## 2022-06-28 DIAGNOSIS — R110 Nausea: Secondary | ICD-10-CM | POA: Diagnosis not present

## 2022-06-28 DIAGNOSIS — I2510 Atherosclerotic heart disease of native coronary artery without angina pectoris: Secondary | ICD-10-CM | POA: Diagnosis not present

## 2022-06-28 DIAGNOSIS — Z7984 Long term (current) use of oral hypoglycemic drugs: Secondary | ICD-10-CM | POA: Diagnosis not present

## 2022-06-28 DIAGNOSIS — Z7722 Contact with and (suspected) exposure to environmental tobacco smoke (acute) (chronic): Secondary | ICD-10-CM | POA: Diagnosis not present

## 2022-07-01 DIAGNOSIS — H814 Vertigo of central origin: Secondary | ICD-10-CM | POA: Diagnosis not present

## 2022-07-08 DIAGNOSIS — M1611 Unilateral primary osteoarthritis, right hip: Secondary | ICD-10-CM | POA: Diagnosis not present

## 2022-07-08 DIAGNOSIS — M25551 Pain in right hip: Secondary | ICD-10-CM | POA: Diagnosis not present

## 2022-07-08 DIAGNOSIS — M25552 Pain in left hip: Secondary | ICD-10-CM | POA: Diagnosis not present

## 2022-07-11 DIAGNOSIS — M1611 Unilateral primary osteoarthritis, right hip: Secondary | ICD-10-CM | POA: Diagnosis not present

## 2022-08-20 DIAGNOSIS — M1611 Unilateral primary osteoarthritis, right hip: Secondary | ICD-10-CM | POA: Diagnosis not present

## 2022-08-20 DIAGNOSIS — M549 Dorsalgia, unspecified: Secondary | ICD-10-CM | POA: Diagnosis not present

## 2022-08-20 DIAGNOSIS — G8929 Other chronic pain: Secondary | ICD-10-CM | POA: Diagnosis not present

## 2022-08-20 DIAGNOSIS — M25551 Pain in right hip: Secondary | ICD-10-CM | POA: Diagnosis not present

## 2022-09-03 DIAGNOSIS — H35373 Puckering of macula, bilateral: Secondary | ICD-10-CM | POA: Diagnosis not present

## 2022-09-03 DIAGNOSIS — H43822 Vitreomacular adhesion, left eye: Secondary | ICD-10-CM | POA: Diagnosis not present

## 2022-09-03 DIAGNOSIS — H59033 Cystoid macular edema following cataract surgery, bilateral: Secondary | ICD-10-CM | POA: Diagnosis not present

## 2022-09-03 DIAGNOSIS — H30042 Focal chorioretinal inflammation, macular or paramacular, left eye: Secondary | ICD-10-CM | POA: Diagnosis not present

## 2022-09-16 DIAGNOSIS — E785 Hyperlipidemia, unspecified: Secondary | ICD-10-CM | POA: Diagnosis not present

## 2022-09-16 DIAGNOSIS — K219 Gastro-esophageal reflux disease without esophagitis: Secondary | ICD-10-CM | POA: Diagnosis not present

## 2022-09-16 DIAGNOSIS — J449 Chronic obstructive pulmonary disease, unspecified: Secondary | ICD-10-CM | POA: Diagnosis not present

## 2022-09-16 DIAGNOSIS — I10 Essential (primary) hypertension: Secondary | ICD-10-CM | POA: Diagnosis not present

## 2022-09-16 DIAGNOSIS — Z0000 Encounter for general adult medical examination without abnormal findings: Secondary | ICD-10-CM | POA: Diagnosis not present

## 2022-09-16 DIAGNOSIS — E1143 Type 2 diabetes mellitus with diabetic autonomic (poly)neuropathy: Secondary | ICD-10-CM | POA: Diagnosis not present

## 2022-09-26 ENCOUNTER — Ambulatory Visit (INDEPENDENT_AMBULATORY_CARE_PROVIDER_SITE_OTHER): Payer: 59 | Admitting: Urology

## 2022-09-26 ENCOUNTER — Encounter: Payer: Self-pay | Admitting: Urology

## 2022-09-26 VITALS — BP 144/82 | HR 82

## 2022-09-26 DIAGNOSIS — R972 Elevated prostate specific antigen [PSA]: Secondary | ICD-10-CM | POA: Diagnosis not present

## 2022-09-26 MED ORDER — TAMSULOSIN HCL 0.4 MG PO CAPS: 0.4000 mg | ORAL_CAPSULE | Freq: Every day | ORAL | 11 refills | Status: DC

## 2022-09-26 NOTE — Patient Instructions (Signed)

## 2022-09-26 NOTE — Addendum Note (Signed)
Addended by: Cleon Gustin on: 09/26/2022 12:47 PM   Modules accepted: Orders

## 2022-09-26 NOTE — Progress Notes (Signed)
post void residual=8 

## 2022-09-26 NOTE — Progress Notes (Signed)
09/26/2022 12:42 PM   Bryan Levy 01-16-1951 [id number]  Referring provider: Neale Burly, MD [place],  [place] [phone]  Elevated PSA   HPI: Mr Bryan Levy is a 72yo here for evaluation of elevated PSA. PSA 4.5 in 05/2022. No prior PSAs. NO significant LUTS. IPSS 9 QOL 1 on no BPH therapy. No family hx of prostate cancer   PMH: Past Medical History:  Diagnosis Date   Ankle dislocation, left, initial encounter 09/12/2016   Chest pain, unspecified    Closed posterior dislocation of left hip ([place]) 09/12/2016   GERD (gastroesophageal reflux disease)    Heart attack ([place]) 2008   Hyperlipidemia    Hypertension    Intermediate coronary syndrome ([place]) 04/23/2017   pt believes related to panic attack   Personal history of tobacco use, presenting hazards to health    Polysubstance abuse ([place])    Shortness of breath     Surgical History: Past Surgical History:  Procedure Laterality Date   CATARACT EXTRACTION W/PHACO Right 05/12/2022   Procedure: CATARACT EXTRACTION PHACO AND INTRAOCULAR LENS PLACEMENT ([place]);  Surgeon: Baruch Goldmann, MD;  Location: [facility];  Service: Ophthalmology;  Laterality: Right;  CDE 9.25   CATARACT EXTRACTION W/PHACO Left 06/06/2022   Procedure: CATARACT EXTRACTION PHACO AND INTRAOCULAR LENS PLACEMENT ([facility]);  Surgeon: Baruch Goldmann, MD;  Location: [facility];  Service: Ophthalmology;  Laterality: Left;  CDE: 9.87   COLONOSCOPY  11/2018   Dr. Ladona Horns: severe diverticulosis with associated muscular hypertrophy. sessile polyp 3-79m removed from ascending colon (tubular adenoma). next TCS in five year.    ESOPHAGOGASTRODUODENOSCOPY  2018   [facility], potential MW tear, H.pylori gastritis. Eradication [place]   Procedure: IRRIGATION AND DEBRIDEMENT LEFT KNEE;  Surgeon: MAltamese Windthorst MD;  [place]  Service: Orthopedics;  Laterality: Left;    ORIF ACETABULAR FRACTURE Left 09/11/2016   Procedure: OPEN REDUCTION INTERNAL FIXATION (ORIF) ACETABULAR FRACTURE;  Surgeon: MAltamese Boswell MD;  [place]  Service: Orthopedics;  Laterality: Left;   ORIF ANKLE FRACTURE Left 09/11/2016   Procedure: OPEN REDUCTION INTERNAL FIXATION (ORIF) LEFT ANKLE FRACTURE;  Surgeon: MAltamese Story MD;  [place]  Service: Orthopedics;  Laterality: Left;   TONSILLECTOMY     TOTAL HIP ARTHROPLASTY Left 05/04/2017   Procedure: LEFT TOTAL HIP ARTHROPLASTY ANTERIOR APPROACH;  Surgeon: RFrederik Pear MD;  [place]  Service: Orthopedics;  Laterality: Left;    Home Medications:  Allergies as of 09/26/2022       Reactions   Lisinopril Swelling        Medication List        Accurate as of September 26, 2022 12:42 PM. If you have any questions, ask your nurse or doctor.          amLODipine 5 MG tablet Commonly known as: NORVASC Take 1 tablet by mouth once daily   aspirin 81 MG tablet Take 81 mg by mouth daily.   blood glucose meter kit and supplies Dispense based on patient and insurance preference. Use up to four times daily as directed. (FOR ICD-10 E10.9, E11.9).   carvedilol 6.25 MG tablet Commonly known as: COREG Take 6.25 mg by mouth daily.   glipiZIDE 5 MG tablet Commonly known as: GLUCOTROL Take 5 mg by mouth daily.   meloxicam 7.5 MG tablet Commonly known as: MOBIC Take 15 mg by mouth daily.   multivitamin  tablet Take 1 tablet by mouth daily.   omeprazole 20 MG capsule Commonly known as: PRILOSEC Take 1 capsule (20 mg total) by mouth daily. Take 1 capsule ('20mg'$  total) po mouth daily 30 mins before breakfast for heartburn.   ONE TOUCH ULTRA 2 w/Device Kit Dispense based on patient and insurance preference. Use up to four times daily as directed. (FOR ICD-10 E10.9, E11.9).   OneTouch Delica Lancets [id number] Misc Dispense based on patient and insurance preference. Use up to four times daily as directed. (FOR ICD-10  E10.9, E11.9).   OneTouch Ultra test strip Generic drug: glucose blood Use as instructed Dispense based on patient and insurance preference. Use up to four times daily as directed. (FOR ICD-10 E10.9, E11.9).   pravastatin 40 MG tablet Commonly known as: PRAVACHOL Take 1 tablet (40 mg total) by mouth every evening.   Symbicort 160-4.5 MCG/ACT inhaler Generic drug: budesonide-formoterol in the morning and at bedtime.        Allergies:  Allergies  Allergen Reactions   Lisinopril Swelling    Family History: Family History  Problem Relation Age of Onset   Hypertension Mother    Hypertension Sister    Coronary artery disease Other        NEGATIVE     Social History:  reports that he has quit smoking. His smoking use included cigarettes. He has a 48.00 pack-year smoking history. He has never used smokeless tobacco. He reports that he does not currently use alcohol. He reports that he does not use drugs.  ROS: All other review of systems were reviewed and are negative except what is noted above in HPI  Physical Exam: BP (!) 144/82   Pulse 82   Constitutional:  Alert and oriented, No acute distress. HEENT: [place] AT, moist mucus membranes.  Trachea midline, no masses. Cardiovascular: No clubbing, cyanosis, or edema. Respiratory: Normal respiratory effort, no increased work of breathing. GI: Abdomen is soft, nontender, nondistended, no abdominal masses GU: No CVA tenderness. Circumcised phallus. No masses/lesions on penis, testis, scrotum. Prostate 40g smooth no nodules no induration.  Lymph: No cervical or inguinal lymphadenopathy. Skin: No rashes, bruises or suspicious lesions. Neurologic: Grossly intact, no focal deficits, moving all 4 extremities. Psychiatric: Normal mood and affect.  Laboratory Data: Lab Results  Component Value Date   WBC 6.0 10/21/2017   HGB 13.3 10/21/2017   HCT 42.3 10/21/2017   MCV 82.6 10/21/2017   PLT 253 10/21/2017    Lab Results   Component Value Date   CREATININE 1.23 10/21/2017    No results found for: "PSA"  No results found for: "TESTOSTERONE"  Lab Results  Component Value Date   HGBA1C 7.4 09/23/2021    Urinalysis    Component Value Date/Time   COLORURINE YELLOW 04/23/2017 1329   APPEARANCEUR CLEAR 04/23/2017 1329   LABSPEC 1.018 04/23/2017 1329   PHURINE 6.0 04/23/2017 1329   GLUCOSEU NEGATIVE 04/23/2017 1329   HGBUR NEGATIVE 04/23/2017 1329   BILIRUBINUR NEGATIVE 04/23/2017 1329   KETONESUR NEGATIVE 04/23/2017 1329   PROTEINUR NEGATIVE 04/23/2017 1329   NITRITE NEGATIVE 04/23/2017 1329   LEUKOCYTESUR NEGATIVE 04/23/2017 1329    No results found for: "LABMICR", "WBCUA", "RBCUA", "LABEPIT", "MUCUS", "BACTERIA"  Pertinent Imaging:  No results found for this or any previous visit.  No results found for this or any previous visit.  No results found for this or any previous visit.  No results found for this or any previous visit.  No results found for this or any  previous visit.  No valid procedures specified. No results found for this or any previous visit.  No results found for this or any previous visit.   Assessment & Plan:    1. Elevated PSA -Repeat PSA today, if it is persistently elevated we will proceed with prostate biopsy. If PSA is less than 4 then I will see him back in 6 months with a PSA.    No follow-ups on file.  Nicolette Bang, MD  [facility] [place]

## 2022-09-27 LAB — PSA: Prostate Specific Ag, Serum: 4 ng/mL (ref 0.0–4.0)

## 2022-09-30 DIAGNOSIS — L03011 Cellulitis of right finger: Secondary | ICD-10-CM | POA: Diagnosis not present

## 2022-09-30 DIAGNOSIS — L0889 Other specified local infections of the skin and subcutaneous tissue: Secondary | ICD-10-CM | POA: Diagnosis not present

## 2022-10-02 DIAGNOSIS — H30042 Focal chorioretinal inflammation, macular or paramacular, left eye: Secondary | ICD-10-CM | POA: Diagnosis not present

## 2022-10-02 DIAGNOSIS — H43822 Vitreomacular adhesion, left eye: Secondary | ICD-10-CM | POA: Diagnosis not present

## 2022-10-02 DIAGNOSIS — H35373 Puckering of macula, bilateral: Secondary | ICD-10-CM | POA: Diagnosis not present

## 2022-10-02 DIAGNOSIS — H59033 Cystoid macular edema following cataract surgery, bilateral: Secondary | ICD-10-CM | POA: Diagnosis not present

## 2022-10-20 ENCOUNTER — Other Ambulatory Visit: Payer: Self-pay | Admitting: Cardiology

## 2022-10-28 DIAGNOSIS — Z1383 Encounter for screening for respiratory disorder NEC: Secondary | ICD-10-CM | POA: Diagnosis not present

## 2022-10-28 DIAGNOSIS — Z136 Encounter for screening for cardiovascular disorders: Secondary | ICD-10-CM | POA: Diagnosis not present

## 2022-10-28 DIAGNOSIS — Z1389 Encounter for screening for other disorder: Secondary | ICD-10-CM | POA: Diagnosis not present

## 2022-10-28 DIAGNOSIS — Z87891 Personal history of nicotine dependence: Secondary | ICD-10-CM | POA: Diagnosis not present

## 2022-10-30 DIAGNOSIS — F17210 Nicotine dependence, cigarettes, uncomplicated: Secondary | ICD-10-CM | POA: Diagnosis not present

## 2022-10-30 DIAGNOSIS — R911 Solitary pulmonary nodule: Secondary | ICD-10-CM | POA: Diagnosis not present

## 2022-10-30 DIAGNOSIS — I700 Atherosclerosis of aorta: Secondary | ICD-10-CM | POA: Diagnosis not present

## 2022-10-30 DIAGNOSIS — I2510 Atherosclerotic heart disease of native coronary artery without angina pectoris: Secondary | ICD-10-CM | POA: Diagnosis not present

## 2022-10-30 DIAGNOSIS — Z122 Encounter for screening for malignant neoplasm of respiratory organs: Secondary | ICD-10-CM | POA: Diagnosis not present

## 2022-10-30 DIAGNOSIS — J439 Emphysema, unspecified: Secondary | ICD-10-CM | POA: Diagnosis not present

## 2022-12-02 DIAGNOSIS — M1611 Unilateral primary osteoarthritis, right hip: Secondary | ICD-10-CM | POA: Diagnosis not present

## 2022-12-02 DIAGNOSIS — M549 Dorsalgia, unspecified: Secondary | ICD-10-CM | POA: Diagnosis not present

## 2022-12-02 DIAGNOSIS — G8929 Other chronic pain: Secondary | ICD-10-CM | POA: Diagnosis not present

## 2022-12-02 DIAGNOSIS — M25551 Pain in right hip: Secondary | ICD-10-CM | POA: Diagnosis not present

## 2022-12-05 DIAGNOSIS — G8929 Other chronic pain: Secondary | ICD-10-CM | POA: Diagnosis not present

## 2022-12-05 DIAGNOSIS — M25551 Pain in right hip: Secondary | ICD-10-CM | POA: Diagnosis not present

## 2022-12-16 DIAGNOSIS — I10 Essential (primary) hypertension: Secondary | ICD-10-CM | POA: Diagnosis not present

## 2022-12-16 DIAGNOSIS — K219 Gastro-esophageal reflux disease without esophagitis: Secondary | ICD-10-CM | POA: Diagnosis not present

## 2022-12-16 DIAGNOSIS — Z0000 Encounter for general adult medical examination without abnormal findings: Secondary | ICD-10-CM | POA: Diagnosis not present

## 2022-12-16 DIAGNOSIS — E785 Hyperlipidemia, unspecified: Secondary | ICD-10-CM | POA: Diagnosis not present

## 2022-12-16 DIAGNOSIS — E1143 Type 2 diabetes mellitus with diabetic autonomic (poly)neuropathy: Secondary | ICD-10-CM | POA: Diagnosis not present

## 2022-12-16 DIAGNOSIS — J449 Chronic obstructive pulmonary disease, unspecified: Secondary | ICD-10-CM | POA: Diagnosis not present

## 2022-12-16 DIAGNOSIS — I700 Atherosclerosis of aorta: Secondary | ICD-10-CM | POA: Diagnosis not present

## 2023-01-01 ENCOUNTER — Other Ambulatory Visit: Payer: Self-pay | Admitting: Cardiology

## 2023-02-02 DIAGNOSIS — M810 Age-related osteoporosis without current pathological fracture: Secondary | ICD-10-CM | POA: Diagnosis not present

## 2023-03-18 DIAGNOSIS — J449 Chronic obstructive pulmonary disease, unspecified: Secondary | ICD-10-CM | POA: Diagnosis not present

## 2023-03-18 DIAGNOSIS — E785 Hyperlipidemia, unspecified: Secondary | ICD-10-CM | POA: Diagnosis not present

## 2023-03-18 DIAGNOSIS — I10 Essential (primary) hypertension: Secondary | ICD-10-CM | POA: Diagnosis not present

## 2023-03-18 DIAGNOSIS — E1143 Type 2 diabetes mellitus with diabetic autonomic (poly)neuropathy: Secondary | ICD-10-CM | POA: Diagnosis not present

## 2023-03-18 DIAGNOSIS — K219 Gastro-esophageal reflux disease without esophagitis: Secondary | ICD-10-CM | POA: Diagnosis not present

## 2023-03-18 DIAGNOSIS — I700 Atherosclerosis of aorta: Secondary | ICD-10-CM | POA: Diagnosis not present

## 2023-03-18 DIAGNOSIS — N182 Chronic kidney disease, stage 2 (mild): Secondary | ICD-10-CM | POA: Diagnosis not present

## 2023-03-24 DIAGNOSIS — Z7984 Long term (current) use of oral hypoglycemic drugs: Secondary | ICD-10-CM | POA: Diagnosis not present

## 2023-03-24 DIAGNOSIS — Z961 Presence of intraocular lens: Secondary | ICD-10-CM | POA: Diagnosis not present

## 2023-03-24 DIAGNOSIS — H35371 Puckering of macula, right eye: Secondary | ICD-10-CM | POA: Diagnosis not present

## 2023-03-24 DIAGNOSIS — E119 Type 2 diabetes mellitus without complications: Secondary | ICD-10-CM | POA: Diagnosis not present

## 2023-04-08 ENCOUNTER — Other Ambulatory Visit: Payer: 59

## 2023-04-17 ENCOUNTER — Ambulatory Visit: Payer: 59 | Admitting: Urology

## 2023-05-12 ENCOUNTER — Other Ambulatory Visit: Payer: 59

## 2023-05-13 LAB — PSA: Prostate Specific Ag, Serum: 4.6 ng/mL — ABNORMAL HIGH (ref 0.0–4.0)

## 2023-05-22 ENCOUNTER — Ambulatory Visit (INDEPENDENT_AMBULATORY_CARE_PROVIDER_SITE_OTHER): Payer: 59 | Admitting: Urology

## 2023-05-22 VITALS — BP 166/89 | HR 84

## 2023-05-22 DIAGNOSIS — N401 Enlarged prostate with lower urinary tract symptoms: Secondary | ICD-10-CM

## 2023-05-22 DIAGNOSIS — R350 Frequency of micturition: Secondary | ICD-10-CM

## 2023-05-22 DIAGNOSIS — R972 Elevated prostate specific antigen [PSA]: Secondary | ICD-10-CM

## 2023-05-22 DIAGNOSIS — N138 Other obstructive and reflux uropathy: Secondary | ICD-10-CM

## 2023-05-22 LAB — URINALYSIS, ROUTINE W REFLEX MICROSCOPIC
Bilirubin, UA: NEGATIVE
Glucose, UA: NEGATIVE
Ketones, UA: NEGATIVE
Leukocytes,UA: NEGATIVE
Nitrite, UA: NEGATIVE
Protein,UA: NEGATIVE
RBC, UA: NEGATIVE
Specific Gravity, UA: 1.02 (ref 1.005–1.030)
Urobilinogen, Ur: 0.2 mg/dL (ref 0.2–1.0)
pH, UA: 6 (ref 5.0–7.5)

## 2023-05-22 MED ORDER — SILODOSIN 8 MG PO CAPS: 8.0000 mg | ORAL_CAPSULE | Freq: Every day | ORAL | 11 refills | Status: DC

## 2023-05-22 NOTE — Progress Notes (Unsigned)
05/22/2023 12:56 PM   Bryan Levy 02/16/1951 [id number]  Referring provider: Toma Deiters, MD [address] [place],  [place] [address]  Followup elevated PSA   HPI: Mr Bonano is a 72yo here for followup for elevated PSA. PSA increased to 4.6 from 4.0. PSA was 4.6 in 05/2022. IPSS 7 QOL 4 on flomax 0.4mg  daily. He has nocturia once a night. He has bothersome urinary frequency. His urinary frequency is every 1-2 hours.    PMH: Past Medical History:  Diagnosis Date   Ankle dislocation, left, initial encounter 09/12/2016   Chest pain, unspecified    Closed posterior dislocation of left hip ([facility]) 09/12/2016   GERD (gastroesophageal reflux disease)    Heart attack ([facility]) 2008   Hyperlipidemia    Hypertension    Intermediate coronary syndrome ([facility]) 04/23/2017   pt believes related to panic attack   Personal history of tobacco use, presenting hazards to health    Polysubstance abuse ([facility])    Shortness of breath     Surgical History: Past Surgical History:  Procedure Laterality Date   CATARACT EXTRACTION W/PHACO Right 05/12/2022   Procedure: CATARACT EXTRACTION PHACO AND INTRAOCULAR LENS PLACEMENT ([facility]);  Surgeon: Fabio Pierce, MD;  [facility];  Service: Ophthalmology;  Laterality: Right;  CDE 9.25   CATARACT EXTRACTION W/PHACO Left 06/06/2022   Procedure: CATARACT EXTRACTION PHACO AND INTRAOCULAR LENS PLACEMENT ([facility]);  Surgeon: Fabio Pierce, MD;  Location: [facility];  Service: Ophthalmology;  Laterality: Left;  CDE: 9.87   COLONOSCOPY  11/2018   Dr. Marcha Solders: severe diverticulosis with associated muscular hypertrophy. sessile polyp 3-28mm removed from ascending colon (tubular adenoma). next TCS in five year.    ESOPHAGOGASTRODUODENOSCOPY  2018   [facility]: erosive reflux esophagitis, potential MW tear, H.pylori gastritis. Eradication documented per breath test in 2020   IRRIGATION AND DEBRIDEMENT KNEE Left 09/11/2016   Procedure: IRRIGATION AND DEBRIDEMENT LEFT KNEE;   Surgeon: Myrene Galas, MD;  Location: [facility] OR;  Service: Orthopedics;  Laterality: Left;   ORIF ACETABULAR FRACTURE Left 09/11/2016   Procedure: OPEN REDUCTION INTERNAL FIXATION (ORIF) ACETABULAR FRACTURE;  Surgeon: Myrene Galas, MD;  Location: [facility] OR;  Service: Orthopedics;  Laterality: Left;   ORIF ANKLE FRACTURE Left 09/11/2016   Procedure: OPEN REDUCTION INTERNAL FIXATION (ORIF) LEFT ANKLE FRACTURE;  Surgeon: Myrene Galas, MD;  Location: [facility] OR;  Service: Orthopedics;  Laterality: Left;   TONSILLECTOMY     TOTAL HIP ARTHROPLASTY Left 05/04/2017   Procedure: LEFT TOTAL HIP ARTHROPLASTY ANTERIOR APPROACH;  Surgeon: Gean Birchwood, MD;  [facility];  Service: Orthopedics;  Laterality: Left;    Home Medications:  Allergies as of 05/22/2023       Reactions   Lisinopril Swelling        Medication List        Accurate as of May 22, 2023 12:56 PM. If you have any questions, ask your nurse or doctor.          amLODipine 5 MG tablet Commonly known as: NORVASC Take 1 tablet by mouth once daily   aspirin 81 MG tablet Take 81 mg by mouth daily.   blood glucose meter kit and supplies Dispense based on patient and insurance preference. Use up to four times daily as directed. (FOR ICD-10 E10.9, E11.9).   carvedilol 6.25 MG tablet Commonly known as: COREG Take 6.25 mg by mouth daily.   glipiZIDE 5 MG tablet Commonly known as: GLUCOTROL Take 5 mg by mouth daily.   meloxicam 7.5  MG tablet Commonly known as: MOBIC Take 15 mg by mouth daily.   multivitamin tablet Take 1 tablet by mouth daily.   omeprazole 20 MG capsule Commonly known as: PRILOSEC Take 1 capsule (20 mg total) by mouth daily. Take 1 capsule (20mg  total) po mouth daily 30 mins before breakfast for heartburn.   ONE TOUCH ULTRA 2 w/Device Kit Dispense based on patient and insurance preference. Use up to four times daily as directed. (FOR ICD-10 E10.9, E11.9).   OneTouch Delica Lancets 33G Misc Dispense  based on patient and insurance preference. Use up to four times daily as directed. (FOR ICD-10 E10.9, E11.9).   OneTouch Ultra test strip Generic drug: glucose blood Use as instructed Dispense based on patient and insurance preference. Use up to four times daily as directed. (FOR ICD-10 E10.9, E11.9).   pravastatin 40 MG tablet Commonly known as: PRAVACHOL TAKE 1 TABLET BY MOUTH ONCE  DAILY IN THE EVENING   Symbicort 160-4.5 MCG/ACT inhaler Generic drug: budesonide-formoterol in the morning and at bedtime.   tamsulosin 0.4 MG Caps capsule Commonly known as: FLOMAX Take 1 capsule (0.4 mg total) by mouth daily after supper.        Allergies:  Allergies  Allergen Reactions   Lisinopril Swelling    Family History: Family History  Problem Relation Age of Onset   Hypertension Mother    Hypertension Sister    Coronary artery disease Other        NEGATIVE     Social History:  reports that he has quit smoking. His smoking use included cigarettes. He has a 48 pack-year smoking history. He has never used smokeless tobacco. He reports that he does not currently use alcohol. He reports that he does not use drugs.  ROS: All other review of systems were reviewed and are negative except what is noted above in HPI  Physical Exam: BP (!) 166/89   Pulse 84   Constitutional:  Alert and oriented, No acute distress. HEENT: [place] AT, moist mucus membranes.  Trachea midline, no masses. Cardiovascular: No clubbing, cyanosis, or edema. Respiratory: Normal respiratory effort, no increased work of breathing. GI: Abdomen is soft, nontender, nondistended, no abdominal masses GU: No CVA tenderness.  Lymph: No cervical or inguinal lymphadenopathy. Skin: No rashes, bruises or suspicious lesions. Neurologic: Grossly intact, no focal deficits, moving all 4 extremities. Psychiatric: Normal mood and affect.  Laboratory Data: Lab Results  Component Value Date   WBC 6.0 10/21/2017   HGB 13.3  10/21/2017   HCT 42.3 10/21/2017   MCV 82.6 10/21/2017   PLT 253 10/21/2017    Lab Results  Component Value Date   CREATININE 1.23 10/21/2017    No results found for: "PSA"  No results found for: "TESTOSTERONE"  Lab Results  Component Value Date   HGBA1C 7.4 09/23/2021    Urinalysis    Component Value Date/Time   COLORURINE YELLOW 04/23/2017 1329   APPEARANCEUR CLEAR 04/23/2017 1329   LABSPEC 1.018 04/23/2017 1329   PHURINE 6.0 04/23/2017 1329   GLUCOSEU NEGATIVE 04/23/2017 1329   HGBUR NEGATIVE 04/23/2017 1329   BILIRUBINUR NEGATIVE 04/23/2017 1329   KETONESUR NEGATIVE 04/23/2017 1329   PROTEINUR NEGATIVE 04/23/2017 1329   NITRITE NEGATIVE 04/23/2017 1329   LEUKOCYTESUR NEGATIVE 04/23/2017 1329    No results found for: "LABMICR", "WBCUA", "RBCUA", "LABEPIT", "MUCUS", "BACTERIA"  Pertinent Imaging:  No results found for this or any previous visit.  No results found for this or any previous visit.  No results found for  this or any previous visit.  No results found for this or any previous visit.  No results found for this or any previous visit.  No valid procedures specified. No results found for this or any previous visit.  No results found for this or any previous visit.   Assessment & Plan:    1. Elevated PSA Followup 6 months with a PSA - Urinalysis, Routine w reflex microscopic  2. BPH with Urinary frequency -rapaflo 8mg  qhs   No follow-ups on file.  Wilkie Aye, MD  [facility] [place]

## 2023-05-25 ENCOUNTER — Encounter: Payer: Self-pay | Admitting: Nurse Practitioner

## 2023-05-25 ENCOUNTER — Ambulatory Visit: Payer: 59 | Attending: Nurse Practitioner | Admitting: Nurse Practitioner

## 2023-05-25 VITALS — BP 177/110 | HR 94 | Ht 69.0 in | Wt 207.8 lb

## 2023-05-25 DIAGNOSIS — E785 Hyperlipidemia, unspecified: Secondary | ICD-10-CM

## 2023-05-25 DIAGNOSIS — I10 Essential (primary) hypertension: Secondary | ICD-10-CM | POA: Diagnosis not present

## 2023-05-25 DIAGNOSIS — I2510 Atherosclerotic heart disease of native coronary artery without angina pectoris: Secondary | ICD-10-CM | POA: Diagnosis not present

## 2023-05-25 DIAGNOSIS — Z79899 Other long term (current) drug therapy: Secondary | ICD-10-CM | POA: Diagnosis not present

## 2023-05-25 DIAGNOSIS — R0602 Shortness of breath: Secondary | ICD-10-CM

## 2023-05-25 DIAGNOSIS — R350 Frequency of micturition: Secondary | ICD-10-CM | POA: Diagnosis not present

## 2023-05-25 DIAGNOSIS — E782 Mixed hyperlipidemia: Secondary | ICD-10-CM

## 2023-05-25 MED ORDER — BLOOD PRESSURE MONITOR DEVI: 1.0000 | Freq: Every day | 0 refills | Status: AC

## 2023-05-25 NOTE — Patient Instructions (Addendum)
Medication Instructions:  Your physician recommends that you continue on your current medications as directed. Please refer to the Current Medication list given to you today.  Labwork: In 1 week   Testing/Procedures: None   Follow-Up: Your physician recommends that you schedule a follow-up appointment in:  1 week nurse visit for BP check  4-6 weeks   Any Other Special Instructions Will Be Listed Below (If Applicable).   If you need a refill on your cardiac medications before your next appointment, please call your pharmacy.

## 2023-05-25 NOTE — Progress Notes (Unsigned)
Cardiology Office Note:  .   Date:  05/25/2023 ID:  Bryan Levy, DOB 1950-07-22, MRN [id number] PCP: Toma Deiters, MD  [facility] Providers Cardiologist:  Bryan Rich, MD    History of Present Illness: .   Bryan Levy is a 72 y.o. male with a PMH of CAD, hypertension, hep C, past history of GI bleed, type 2 diabetes, hyperlipidemia, COPD, history of tobacco use, GERD, who presents today for overdue 1 year follow-up.  Last seen by Dr. Dina Levy on January 31, 2022.  Overall he was doing well at the time.  Today he presents for overdue 1 year follow-up.  He states he is not sure what medication he is on. CMA Sharen Hones has called patient's pharmacy and med list is up to date. Overall doing well from a cardiac perspective and denies any acute cardiac complaints. Denies any chest pain, shortness of breath, palpitations, syncope, presyncope, dizziness, orthopnea, PND, swelling or significant weight changes, acute bleeding, or claudication.   ROS: Negative. See HPI.   Studies Reviewed: Marland Kitchen    EKG:  EKG Interpretation Date/Time:  Monday May 25 2023 15:22:34 EST Ventricular Rate:  94 PR Interval:  150 QRS Duration:  90 QT Interval:  368 QTC Calculation: 460 R Axis:   -64  Text Interpretation: Sinus rhythm with Premature atrial complexes Left axis deviation Possible Anterior infarct , age undetermined When compared with ECG of 13-Apr-2010 04:55, Premature atrial complexes are now Present T wave inversion no longer evident in Inferior leads Confirmed by Sharlene Dory [phone]) on 05/25/2023 3:50:18 PM   Echo 02/2017:  Study Conclusions   - Left ventricle: The cavity size was normal. Wall thickness was    normal. Systolic function was normal. The estimated ejection    fraction was in the range of 55% to 60%. Wall motion was normal;    there were no regional wall motion abnormalities. Doppler    parameters are consistent with abnormal left ventricular     relaxation (grade 1 diastolic dysfunction).  - Aortic valve: Valve area (VTI): 2.74 cm^2. Valve area (Vmax):    2.42 cm^2. Valve area (Vmean): 2.36 cm^2.  - Technically adequate study.  Lexiscan 02/2017:  Inferior/inferolateral defect that improves incompletely consistent with ischemia in inferolateral wall (base, minimally mid) and scar. Coexistent soft tissue attenuation (diaphragm, overlying bowel activity) makes evaluation difficult This is a high risk study. Nuclear stress EF: 38%.  Physical Exam:   VS:  BP (!) 177/110 (BP Location: Right Arm, Patient Position: Sitting, Cuff Size: Normal)   Pulse 94   Ht 5\' 9"  (1.753 m)   Wt 207 lb 12.8 oz (94.3 kg)   SpO2 94%   BMI 30.69 kg/m    Wt Readings from Last 3 Encounters:  05/25/23 207 lb 12.8 oz (94.3 kg)  06/04/22 195 lb 1.7 oz (88.5 kg)  01/31/22 195 lb 3.2 oz (88.5 kg)    GEN: Well nourished, well developed in no acute distress NECK: No JVD; No carotid bruits CARDIAC: S1/S2, RRR, no murmurs, rubs, gallops RESPIRATORY:  Clear to auscultation without rales, wheezing or rhonchi  ABDOMEN: Soft, non-tender, non-distended EXTREMITIES:  No edema; No deformity   ASSESSMENT AND PLAN: .    Uncontrolled HTN BP elevated and not at goal d/t patient states he is not sure what medication he is on and what he is taking. Will bring him back within the next week and instructed to bring in all medications to verify and will also perform  BP check. Provided Rx for BP cuff. Discussed to monitor BP at home at least 2 hours after medications and sitting for 5-10 minutes. Given BP log and salty six. Continue current medication regimen. Care and ED precautions discussed. Heart healthy diet and regular cardiovascular exercise encouraged.  Will obtain TSH.   CAD Hx of NSTEMI in 2011. In 2018, underwent NST that showed mild inferolateral ischemia and was felt that degree of ischemia would still represent to be low risk.  Echocardiogram at the time revealed  normal EF, no wall motion abnormalities.  CT chest in 2021 revealed CAD. Stable with no anginal symptoms. No indication for ischemic evaluation. No med changes at this time.  He will be coming back for med check/BP check in 1 week-see above. Care and ED precautions discussed. Heart healthy diet and regular cardiovascular exercise encouraged.  Will obtain CBC, CMET, and fasting lipid panel.  HLD, medication management No recent labs on file.  He is due for labs.  Will obtain fasting lipid panel and CMET. Heart healthy diet and regular cardiovascular exercise encouraged.   4.  Urinary frequency Admits to urinary frequency that is affecting his ADLs.  Appears that his symptoms may be due to BPH.  Recommended to follow-up with PCP for further evaluation.  Dispo: Follow-up with me/[facility] in 4 to 6 weeks or sooner anything changes.  Signed, Sharlene Dory, NP

## 2023-05-26 ENCOUNTER — Encounter: Payer: Self-pay | Admitting: Urology

## 2023-05-26 DIAGNOSIS — I10 Essential (primary) hypertension: Secondary | ICD-10-CM | POA: Diagnosis not present

## 2023-05-26 NOTE — Patient Instructions (Signed)

## 2023-05-28 ENCOUNTER — Encounter: Payer: Self-pay | Admitting: Nurse Practitioner

## 2023-06-01 ENCOUNTER — Ambulatory Visit: Payer: 59 | Attending: Internal Medicine

## 2023-06-01 VITALS — BP 168/102 | HR 104 | Ht 69.0 in | Wt 205.6 lb

## 2023-06-01 DIAGNOSIS — I10 Essential (primary) hypertension: Secondary | ICD-10-CM

## 2023-06-01 NOTE — Progress Notes (Signed)
Patient in today for BP check and medication check. Reported no SOB or chest pains. Updated his medications and also spoke with Lanora Manis regarding his Carvedilol. Patient hasn't been taking it and wasn't aware if he was supposed to have stopped it. Lanora Manis stated he needs to be on it taking twice a day. Explained to patient, Carvedilol 6.25 twice daily. Verbalized understanding. Checked BP elevated in both arms. Advised to check daily. And will schedule a nurse visit in 1 wk to recheck.

## 2023-06-08 ENCOUNTER — Ambulatory Visit: Payer: 59 | Attending: Internal Medicine

## 2023-06-08 ENCOUNTER — Telehealth: Payer: Self-pay | Admitting: Nurse Practitioner

## 2023-06-08 DIAGNOSIS — Z79899 Other long term (current) drug therapy: Secondary | ICD-10-CM

## 2023-06-08 DIAGNOSIS — I10 Essential (primary) hypertension: Secondary | ICD-10-CM

## 2023-06-08 DIAGNOSIS — R0602 Shortness of breath: Secondary | ICD-10-CM

## 2023-06-08 MED ORDER — VALSARTAN 160 MG PO TABS: 160.0000 mg | ORAL_TABLET | Freq: Every day | ORAL | 1 refills | Status: DC

## 2023-06-08 MED ORDER — EZETIMIBE 10 MG PO TABS: 10.0000 mg | ORAL_TABLET | Freq: Every day | ORAL | 1 refills | Status: DC

## 2023-06-08 NOTE — Telephone Encounter (Signed)
-----   Message from Sharlene Dory sent at 06/08/2023  8:21 AM EST ----- Thanks for sending his readings. BP not at goal. Stop olmesartan and please start Valsartan 160 mg daily. Please obtain BMET in 10 days and bring back for BP check in 2-3 weeks.   Thanks!   Best,  Sharlene Dory, NP ----- Message ----- From: Sharen Hones Sent: 06/08/2023   8:18 AM EST To: Sharlene Dory, NP

## 2023-06-08 NOTE — Progress Notes (Signed)
Patient here today for BP check per [facility] Patient taking all medications as prescribed  Patient has no CP,SOB or weakness  Patient BP today is  158/100 R 158/100 L  SPO2 94 HR 84   Patient allowed to rest for 10 minutes prior to taking BP  Patient states he is feeling better since starting medication  Patient took meds at ^ am this morning  Patient telephone number [phone] Patient uses [facility] in [place] to [place] for review

## 2023-06-08 NOTE — Progress Notes (Signed)
BP readings reviewed for BP visit today in office. BP is not at goal. Will stop olmesartan and start Valsartan 160 mg daily, obtain BMET in 10 days, and bring back for BP check in 2-3 weeks.   Sharlene Dory, NP

## 2023-06-08 NOTE — Addendum Note (Signed)
Addended by: Sharen Hones on: 06/08/2023 09:09 AM   Modules accepted: Orders

## 2023-06-24 DIAGNOSIS — E1122 Type 2 diabetes mellitus with diabetic chronic kidney disease: Secondary | ICD-10-CM | POA: Diagnosis not present

## 2023-06-24 DIAGNOSIS — I10 Essential (primary) hypertension: Secondary | ICD-10-CM | POA: Diagnosis not present

## 2023-06-24 DIAGNOSIS — J449 Chronic obstructive pulmonary disease, unspecified: Secondary | ICD-10-CM | POA: Diagnosis not present

## 2023-06-24 DIAGNOSIS — K219 Gastro-esophageal reflux disease without esophagitis: Secondary | ICD-10-CM | POA: Diagnosis not present

## 2023-06-24 DIAGNOSIS — E785 Hyperlipidemia, unspecified: Secondary | ICD-10-CM | POA: Diagnosis not present

## 2023-06-24 DIAGNOSIS — I700 Atherosclerosis of aorta: Secondary | ICD-10-CM | POA: Diagnosis not present

## 2023-06-24 DIAGNOSIS — N182 Chronic kidney disease, stage 2 (mild): Secondary | ICD-10-CM | POA: Diagnosis not present

## 2023-06-29 ENCOUNTER — Ambulatory Visit: Payer: 59 | Attending: Cardiology

## 2023-06-29 VITALS — BP 164/92 | HR 80 | Wt 206.8 lb

## 2023-06-29 DIAGNOSIS — I10 Essential (primary) hypertension: Secondary | ICD-10-CM | POA: Diagnosis not present

## 2023-06-29 MED ORDER — CARVEDILOL 12.5 MG PO TABS: 12.5000 mg | ORAL_TABLET | Freq: Two times a day (BID) | ORAL | Status: DC

## 2023-06-29 NOTE — Progress Notes (Signed)
Patient here for a nurse visit, BP check. Medication reviewed. Stated he has taken his Carvedilol and Amlodipine before arriving. Checked BP at home and was around 150/80. Reported no chest pain, shortness of breath or headaches. Stated that he did smoke 2 cigarettes before he arrived. Waited about 10 minutes checked BP Left arm-168/98 and Right 164/92.  Last nurse visit medications were changed. Started taking Carvedilol and Amlodipine. Patient reported symptoms and advised him he could leave we will update provider.

## 2023-06-29 NOTE — Addendum Note (Signed)
Addended by: Carmelina Paddock on: 06/29/2023 04:24 PM   Modules accepted: Orders

## 2023-06-29 NOTE — Progress Notes (Signed)
Called patient advised of Dr. Wyline Mood plan:   Please increase coreg to 12.5mg  bid and repeat nursing visit with vitals check 2 weeks   Dominga Ferry MD     Patient asked what he can do with all the medication he has: Currently has a lot of the 6.25 and 25 mg. Advised him can take 2 of the 6.25 mg twice daily or he can break the 25 mg in half. Stated that he had already took a whole 25 mg earlier. And him steps to continue with updated dosages and to keep his current appointment he has will Lanora Manis on 12/20. Patient verbalized understanding.

## 2023-06-29 NOTE — Progress Notes (Signed)
Please increase coreg to 12.5mg  bid and repeat nursing visit with vitals check 2 weeks  Dominga Ferry MD

## 2023-07-02 ENCOUNTER — Other Ambulatory Visit: Payer: Self-pay | Admitting: Urology

## 2023-07-05 DIAGNOSIS — I6782 Cerebral ischemia: Secondary | ICD-10-CM | POA: Diagnosis not present

## 2023-07-05 DIAGNOSIS — J438 Other emphysema: Secondary | ICD-10-CM | POA: Diagnosis not present

## 2023-07-05 DIAGNOSIS — I2510 Atherosclerotic heart disease of native coronary artery without angina pectoris: Secondary | ICD-10-CM | POA: Diagnosis not present

## 2023-07-05 DIAGNOSIS — I639 Cerebral infarction, unspecified: Secondary | ICD-10-CM | POA: Diagnosis not present

## 2023-07-05 DIAGNOSIS — Z7982 Long term (current) use of aspirin: Secondary | ICD-10-CM | POA: Diagnosis not present

## 2023-07-05 DIAGNOSIS — I252 Old myocardial infarction: Secondary | ICD-10-CM | POA: Diagnosis not present

## 2023-07-05 DIAGNOSIS — R519 Headache, unspecified: Secondary | ICD-10-CM | POA: Diagnosis not present

## 2023-07-05 DIAGNOSIS — I6529 Occlusion and stenosis of unspecified carotid artery: Secondary | ICD-10-CM | POA: Diagnosis not present

## 2023-07-05 DIAGNOSIS — I63239 Cerebral infarction due to unspecified occlusion or stenosis of unspecified carotid arteries: Secondary | ICD-10-CM | POA: Diagnosis not present

## 2023-07-05 DIAGNOSIS — R531 Weakness: Secondary | ICD-10-CM | POA: Diagnosis not present

## 2023-07-05 DIAGNOSIS — R29810 Facial weakness: Secondary | ICD-10-CM | POA: Diagnosis not present

## 2023-07-05 DIAGNOSIS — Z79899 Other long term (current) drug therapy: Secondary | ICD-10-CM | POA: Diagnosis not present

## 2023-07-05 DIAGNOSIS — I6350 Cerebral infarction due to unspecified occlusion or stenosis of unspecified cerebral artery: Secondary | ICD-10-CM | POA: Diagnosis not present

## 2023-07-05 DIAGNOSIS — R29703 NIHSS score 3: Secondary | ICD-10-CM | POA: Diagnosis not present

## 2023-07-05 DIAGNOSIS — I672 Cerebral atherosclerosis: Secondary | ICD-10-CM | POA: Diagnosis not present

## 2023-07-05 DIAGNOSIS — M1612 Unilateral primary osteoarthritis, left hip: Secondary | ICD-10-CM | POA: Diagnosis not present

## 2023-07-05 DIAGNOSIS — I10 Essential (primary) hypertension: Secondary | ICD-10-CM | POA: Diagnosis not present

## 2023-07-05 DIAGNOSIS — G8194 Hemiplegia, unspecified affecting left nondominant side: Secondary | ICD-10-CM | POA: Diagnosis not present

## 2023-07-05 DIAGNOSIS — I69354 Hemiplegia and hemiparesis following cerebral infarction affecting left non-dominant side: Secondary | ICD-10-CM | POA: Diagnosis not present

## 2023-07-05 DIAGNOSIS — I771 Stricture of artery: Secondary | ICD-10-CM | POA: Diagnosis not present

## 2023-07-05 DIAGNOSIS — F17210 Nicotine dependence, cigarettes, uncomplicated: Secondary | ICD-10-CM | POA: Diagnosis not present

## 2023-07-05 DIAGNOSIS — M1990 Unspecified osteoarthritis, unspecified site: Secondary | ICD-10-CM | POA: Diagnosis not present

## 2023-07-05 DIAGNOSIS — E785 Hyperlipidemia, unspecified: Secondary | ICD-10-CM | POA: Diagnosis not present

## 2023-07-05 DIAGNOSIS — R200 Anesthesia of skin: Secondary | ICD-10-CM | POA: Diagnosis not present

## 2023-07-05 DIAGNOSIS — Z7951 Long term (current) use of inhaled steroids: Secondary | ICD-10-CM | POA: Diagnosis not present

## 2023-07-05 DIAGNOSIS — R42 Dizziness and giddiness: Secondary | ICD-10-CM | POA: Diagnosis not present

## 2023-07-05 DIAGNOSIS — Z79891 Long term (current) use of opiate analgesic: Secondary | ICD-10-CM | POA: Diagnosis not present

## 2023-07-05 DIAGNOSIS — Z7902 Long term (current) use of antithrombotics/antiplatelets: Secondary | ICD-10-CM | POA: Diagnosis not present

## 2023-07-06 DIAGNOSIS — I6782 Cerebral ischemia: Secondary | ICD-10-CM | POA: Diagnosis not present

## 2023-07-06 DIAGNOSIS — R42 Dizziness and giddiness: Secondary | ICD-10-CM | POA: Diagnosis not present

## 2023-07-06 DIAGNOSIS — R519 Headache, unspecified: Secondary | ICD-10-CM | POA: Diagnosis not present

## 2023-07-06 DIAGNOSIS — I639 Cerebral infarction, unspecified: Secondary | ICD-10-CM | POA: Diagnosis not present

## 2023-07-07 ENCOUNTER — Telehealth: Payer: Self-pay | Admitting: Cardiology

## 2023-07-07 NOTE — Telephone Encounter (Signed)
Patient states that he is in [facility] and is requesting his labs be taken there, but states they do not see any orders. Requesting call back to see if he can get requesting labs done while he is still in hospital. Please advise.

## 2023-07-07 NOTE — Telephone Encounter (Signed)
Spoke with patient is currently in the hospital he had a stroke. Advised him that I spoke with Lanora Manis and can have those labs address at a later time. To call if he cannot keep his appointment as well. Patient verbalized understanding

## 2023-07-08 DIAGNOSIS — I10 Essential (primary) hypertension: Secondary | ICD-10-CM | POA: Diagnosis not present

## 2023-07-08 DIAGNOSIS — G458 Other transient cerebral ischemic attacks and related syndromes: Secondary | ICD-10-CM | POA: Diagnosis not present

## 2023-07-09 DIAGNOSIS — I69354 Hemiplegia and hemiparesis following cerebral infarction affecting left non-dominant side: Secondary | ICD-10-CM | POA: Diagnosis not present

## 2023-07-09 DIAGNOSIS — I2510 Atherosclerotic heart disease of native coronary artery without angina pectoris: Secondary | ICD-10-CM | POA: Diagnosis not present

## 2023-07-09 DIAGNOSIS — E785 Hyperlipidemia, unspecified: Secondary | ICD-10-CM | POA: Diagnosis not present

## 2023-07-09 DIAGNOSIS — J439 Emphysema, unspecified: Secondary | ICD-10-CM | POA: Diagnosis not present

## 2023-07-09 DIAGNOSIS — Z96652 Presence of left artificial knee joint: Secondary | ICD-10-CM | POA: Diagnosis not present

## 2023-07-09 DIAGNOSIS — I119 Hypertensive heart disease without heart failure: Secondary | ICD-10-CM | POA: Diagnosis not present

## 2023-07-09 DIAGNOSIS — I252 Old myocardial infarction: Secondary | ICD-10-CM | POA: Diagnosis not present

## 2023-07-09 DIAGNOSIS — Z96642 Presence of left artificial hip joint: Secondary | ICD-10-CM | POA: Diagnosis not present

## 2023-07-10 ENCOUNTER — Ambulatory Visit: Payer: 59 | Attending: Nurse Practitioner | Admitting: Nurse Practitioner

## 2023-07-10 ENCOUNTER — Encounter: Payer: Self-pay | Admitting: Nurse Practitioner

## 2023-07-10 VITALS — BP 132/82 | HR 74 | Ht 69.0 in | Wt 201.0 lb

## 2023-07-10 DIAGNOSIS — I2510 Atherosclerotic heart disease of native coronary artery without angina pectoris: Secondary | ICD-10-CM

## 2023-07-10 DIAGNOSIS — I639 Cerebral infarction, unspecified: Secondary | ICD-10-CM

## 2023-07-10 DIAGNOSIS — I10 Essential (primary) hypertension: Secondary | ICD-10-CM | POA: Diagnosis not present

## 2023-07-10 DIAGNOSIS — E785 Hyperlipidemia, unspecified: Secondary | ICD-10-CM | POA: Diagnosis not present

## 2023-07-10 NOTE — Progress Notes (Unsigned)
Cardiology Office Note:  .   Date:  07/10/2023 ID:  Bryan Levy, DOB [birth year], MRN [id number] PCP: Toma Deiters, MD  [facility] Providers Cardiologist:  Dina Rich, MD    History of Present Illness: .   Bryan Levy is a 72 y.o. male with a PMH of CAD, hypertension, hep C, past history of GI bleed, type 2 diabetes, hyperlipidemia, COPD, history of tobacco use, GERD, and hx of recent CVA, who presents today for 4-6 week follow-up.  Last seen by Dr. Dina Rich on January 31, 2022.  Overall he was doing well at the time.  Last seen for 1 year follow-up on May 25, 2023. He was not sure what medication he was on. CMA Sharen Hones ) called patient's pharmacy and med list updated. Was overall doing well from a cardiac perspective and denied any acute cardiac complaints. Denied any chest pain, shortness of breath, palpitations, syncope, presyncope, dizziness, orthopnea, PND, swelling or significant weight changes, acute bleeding, or claudication.  Recent hospital admission at [facility] for acute CVA. CT head and CTA head and neck were largely unremarkable, however MRI was ordered. 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   Today he presents for follow-up. He states he is scheduled to have upcoming therapy arranged after his stroke. Left arm continues to be weak after his stroke and has tongue deviation to the left side, says he has been doing tongue exercises to help with this. Admits to left hip pain. Denies any chest pain and says he is breathing better and tells me he has stopped smoking. BP is much better controlled per his report. Says his BP recently at home has been 126/81. Denies any chest pain, shortness of breath, palpitations, syncope, presyncope, dizziness, orthopnea, PND, swelling or significant weight changes, acute bleeding, or claudication.    ROS: Negative. See HPI.   Studies  Reviewed: Marland Kitchen    EKG: EKG is not ordered today.   Echo 07/07/2023 at [facility]:  Summary  1. The left ventricle is normal in size with upper normal wall thickness.  2. The left ventricular systolic function is normal, LVEF is visually estimated at > 55%.  3. Mitral annular calcification is present.  4. The right ventricle is normal in size, with normal systolic function.  5. There is no evidence of an interatrial flow communication or intrapulmonary shunt by agitated saline study.   Echo 02/2017:  Study Conclusions   - Left ventricle: The cavity size was normal. Wall thickness was    normal. Systolic function was normal. The estimated ejection    fraction was in the range of 55% to 60%. Wall motion was normal;    there were no regional wall motion abnormalities. Doppler    parameters are consistent with abnormal left ventricular    relaxation (grade 1 diastolic dysfunction).  - Aortic valve: Valve area (VTI): 2.74 cm^2. Valve area (Vmax):    2.42 cm^2. Valve area (Vmean): 2.36 cm^2.  - Technically adequate study.  Lexiscan 02/2017:  Inferior/inferolateral defect that improves incompletely consistent with ischemia in inferolateral wall (base, minimally mid) and scar. Coexistent soft tissue attenuation (diaphragm, overlying bowel activity) makes evaluation difficult This is a high risk study. Nuclear stress EF: 38%.  Physical Exam:   VS:  BP 132/82   Pulse 74   Ht 5\' 9"  (1.753 m)   Wt 201 lb (91.2 kg)  SpO2 95%   BMI 29.68 kg/m    Wt Readings from Last 3 Encounters:  07/10/23 201 lb (91.2 kg)  06/29/23 206 lb 12.8 oz (93.8 kg)  06/01/23 205 lb 9.6 oz (93.3 kg)    GEN: Well nourished, well developed in no acute distress NECK: No JVD; No carotid bruits CARDIAC: S1/S2, RRR, no murmurs, rubs, gallops RESPIRATORY:  Clear to auscultation without rales, wheezing or rhonchi  ABDOMEN: Soft, non-tender, non-distended EXTREMITIES:  No edema; No deformity   ASSESSMENT AND  PLAN: .    HTN BP stable and at goal. Discussed to monitor BP at home at least 2 hours after medications and sitting for 5-10 minutes. Previously given BP log and salty six. Continue current medication regimen. Care and ED precautions discussed. Heart healthy diet and regular cardiovascular exercise encouraged.    CAD Hx of NSTEMI in 2011. In 2018, underwent NST that showed mild inferolateral ischemia and was felt that degree of ischemia would still represent to be low risk.  Echocardiogram at the time revealed normal EF, no wall motion abnormalities.  CT chest in 2021 revealed CAD. Stable with no anginal symptoms. No indication for ischemic evaluation. No med changes at this time. Care and ED precautions discussed. Heart healthy diet and regular cardiovascular exercise encouraged.    HLD No recent labs on file. Atorvastatin dose increased after his recent stroke. Will obtain labs at next follow-up. Heart healthy diet and regular cardiovascular exercise encouraged.   4.  Acute CVA Does have some left sided symptoms per his report. Continue to work with therapy as scheduled. Offered/recommended neurology referral, however pt declines. Continue to follow with PCP. Care and ED precautions discussed. Congratulated pt on his decision to stop smoking.  Dispo: Follow-up with me/APP in 6-8 weeks or sooner anything changes.   Signed, Sharlene Dory, NP

## 2023-07-10 NOTE — Patient Instructions (Addendum)

## 2023-07-13 DIAGNOSIS — I119 Hypertensive heart disease without heart failure: Secondary | ICD-10-CM | POA: Diagnosis not present

## 2023-07-13 DIAGNOSIS — I2510 Atherosclerotic heart disease of native coronary artery without angina pectoris: Secondary | ICD-10-CM | POA: Diagnosis not present

## 2023-07-13 DIAGNOSIS — Z96652 Presence of left artificial knee joint: Secondary | ICD-10-CM | POA: Diagnosis not present

## 2023-07-13 DIAGNOSIS — E785 Hyperlipidemia, unspecified: Secondary | ICD-10-CM | POA: Diagnosis not present

## 2023-07-13 DIAGNOSIS — J439 Emphysema, unspecified: Secondary | ICD-10-CM | POA: Diagnosis not present

## 2023-07-13 DIAGNOSIS — Z96642 Presence of left artificial hip joint: Secondary | ICD-10-CM | POA: Diagnosis not present

## 2023-07-13 DIAGNOSIS — I252 Old myocardial infarction: Secondary | ICD-10-CM | POA: Diagnosis not present

## 2023-07-13 DIAGNOSIS — I69354 Hemiplegia and hemiparesis following cerebral infarction affecting left non-dominant side: Secondary | ICD-10-CM | POA: Diagnosis not present

## 2023-07-14 DIAGNOSIS — I119 Hypertensive heart disease without heart failure: Secondary | ICD-10-CM | POA: Diagnosis not present

## 2023-07-14 DIAGNOSIS — J439 Emphysema, unspecified: Secondary | ICD-10-CM | POA: Diagnosis not present

## 2023-07-14 DIAGNOSIS — Z96652 Presence of left artificial knee joint: Secondary | ICD-10-CM | POA: Diagnosis not present

## 2023-07-14 DIAGNOSIS — I69354 Hemiplegia and hemiparesis following cerebral infarction affecting left non-dominant side: Secondary | ICD-10-CM | POA: Diagnosis not present

## 2023-07-14 DIAGNOSIS — E785 Hyperlipidemia, unspecified: Secondary | ICD-10-CM | POA: Diagnosis not present

## 2023-07-14 DIAGNOSIS — Z96642 Presence of left artificial hip joint: Secondary | ICD-10-CM | POA: Diagnosis not present

## 2023-07-14 DIAGNOSIS — I252 Old myocardial infarction: Secondary | ICD-10-CM | POA: Diagnosis not present

## 2023-07-14 DIAGNOSIS — I2510 Atherosclerotic heart disease of native coronary artery without angina pectoris: Secondary | ICD-10-CM | POA: Diagnosis not present

## 2023-07-21 DIAGNOSIS — E785 Hyperlipidemia, unspecified: Secondary | ICD-10-CM | POA: Diagnosis not present

## 2023-07-21 DIAGNOSIS — J439 Emphysema, unspecified: Secondary | ICD-10-CM | POA: Diagnosis not present

## 2023-07-21 DIAGNOSIS — I2510 Atherosclerotic heart disease of native coronary artery without angina pectoris: Secondary | ICD-10-CM | POA: Diagnosis not present

## 2023-07-21 DIAGNOSIS — I69354 Hemiplegia and hemiparesis following cerebral infarction affecting left non-dominant side: Secondary | ICD-10-CM | POA: Diagnosis not present

## 2023-07-21 DIAGNOSIS — Z96652 Presence of left artificial knee joint: Secondary | ICD-10-CM | POA: Diagnosis not present

## 2023-07-21 DIAGNOSIS — I252 Old myocardial infarction: Secondary | ICD-10-CM | POA: Diagnosis not present

## 2023-07-21 DIAGNOSIS — I119 Hypertensive heart disease without heart failure: Secondary | ICD-10-CM | POA: Diagnosis not present

## 2023-07-21 DIAGNOSIS — Z96642 Presence of left artificial hip joint: Secondary | ICD-10-CM | POA: Diagnosis not present

## 2023-07-26 DIAGNOSIS — R29898 Other symptoms and signs involving the musculoskeletal system: Secondary | ICD-10-CM | POA: Diagnosis not present

## 2023-07-26 DIAGNOSIS — J449 Chronic obstructive pulmonary disease, unspecified: Secondary | ICD-10-CM | POA: Diagnosis not present

## 2023-07-26 DIAGNOSIS — M542 Cervicalgia: Secondary | ICD-10-CM | POA: Diagnosis not present

## 2023-07-26 DIAGNOSIS — G44309 Post-traumatic headache, unspecified, not intractable: Secondary | ICD-10-CM | POA: Diagnosis not present

## 2023-07-26 DIAGNOSIS — R079 Chest pain, unspecified: Secondary | ICD-10-CM | POA: Diagnosis not present

## 2023-07-26 DIAGNOSIS — Z7902 Long term (current) use of antithrombotics/antiplatelets: Secondary | ICD-10-CM | POA: Diagnosis not present

## 2023-07-26 DIAGNOSIS — W19XXXA Unspecified fall, initial encounter: Secondary | ICD-10-CM | POA: Diagnosis not present

## 2023-07-26 DIAGNOSIS — R519 Headache, unspecified: Secondary | ICD-10-CM | POA: Diagnosis not present

## 2023-07-26 DIAGNOSIS — I6782 Cerebral ischemia: Secondary | ICD-10-CM | POA: Diagnosis not present

## 2023-07-26 DIAGNOSIS — Z8673 Personal history of transient ischemic attack (TIA), and cerebral infarction without residual deficits: Secondary | ICD-10-CM | POA: Diagnosis not present

## 2023-07-26 DIAGNOSIS — R569 Unspecified convulsions: Secondary | ICD-10-CM | POA: Diagnosis not present

## 2023-07-26 DIAGNOSIS — Z043 Encounter for examination and observation following other accident: Secondary | ICD-10-CM | POA: Diagnosis not present

## 2023-07-26 DIAGNOSIS — G319 Degenerative disease of nervous system, unspecified: Secondary | ICD-10-CM | POA: Diagnosis not present

## 2023-07-26 DIAGNOSIS — E860 Dehydration: Secondary | ICD-10-CM | POA: Diagnosis not present

## 2023-07-26 DIAGNOSIS — Z7951 Long term (current) use of inhaled steroids: Secondary | ICD-10-CM | POA: Diagnosis not present

## 2023-07-26 DIAGNOSIS — I10 Essential (primary) hypertension: Secondary | ICD-10-CM | POA: Diagnosis not present

## 2023-07-26 DIAGNOSIS — Z79899 Other long term (current) drug therapy: Secondary | ICD-10-CM | POA: Diagnosis not present

## 2023-07-26 DIAGNOSIS — Z743 Need for continuous supervision: Secondary | ICD-10-CM | POA: Diagnosis not present

## 2023-07-26 DIAGNOSIS — I2510 Atherosclerotic heart disease of native coronary artery without angina pectoris: Secondary | ICD-10-CM | POA: Diagnosis not present

## 2023-07-26 DIAGNOSIS — M47812 Spondylosis without myelopathy or radiculopathy, cervical region: Secondary | ICD-10-CM | POA: Diagnosis not present

## 2023-07-26 DIAGNOSIS — Z87442 Personal history of urinary calculi: Secondary | ICD-10-CM | POA: Diagnosis not present

## 2023-07-26 DIAGNOSIS — R55 Syncope and collapse: Secondary | ICD-10-CM | POA: Diagnosis not present

## 2023-07-26 DIAGNOSIS — Z7982 Long term (current) use of aspirin: Secondary | ICD-10-CM | POA: Diagnosis not present

## 2023-07-26 DIAGNOSIS — F17210 Nicotine dependence, cigarettes, uncomplicated: Secondary | ICD-10-CM | POA: Diagnosis not present

## 2023-07-27 DIAGNOSIS — E860 Dehydration: Secondary | ICD-10-CM | POA: Diagnosis not present

## 2023-07-27 DIAGNOSIS — E119 Type 2 diabetes mellitus without complications: Secondary | ICD-10-CM | POA: Diagnosis not present

## 2023-07-27 DIAGNOSIS — I2510 Atherosclerotic heart disease of native coronary artery without angina pectoris: Secondary | ICD-10-CM | POA: Diagnosis not present

## 2023-07-27 DIAGNOSIS — I639 Cerebral infarction, unspecified: Secondary | ICD-10-CM | POA: Diagnosis not present

## 2023-07-27 DIAGNOSIS — I10 Essential (primary) hypertension: Secondary | ICD-10-CM | POA: Diagnosis not present

## 2023-07-27 DIAGNOSIS — W19XXXA Unspecified fall, initial encounter: Secondary | ICD-10-CM | POA: Diagnosis not present

## 2023-07-31 DIAGNOSIS — R531 Weakness: Secondary | ICD-10-CM | POA: Diagnosis not present

## 2023-07-31 DIAGNOSIS — I6381 Other cerebral infarction due to occlusion or stenosis of small artery: Secondary | ICD-10-CM | POA: Diagnosis not present

## 2023-07-31 DIAGNOSIS — I10 Essential (primary) hypertension: Secondary | ICD-10-CM | POA: Diagnosis not present

## 2023-07-31 DIAGNOSIS — R4781 Slurred speech: Secondary | ICD-10-CM | POA: Diagnosis not present

## 2023-07-31 DIAGNOSIS — E1169 Type 2 diabetes mellitus with other specified complication: Secondary | ICD-10-CM | POA: Diagnosis not present

## 2023-07-31 DIAGNOSIS — E785 Hyperlipidemia, unspecified: Secondary | ICD-10-CM | POA: Diagnosis not present

## 2023-08-05 DIAGNOSIS — I2510 Atherosclerotic heart disease of native coronary artery without angina pectoris: Secondary | ICD-10-CM | POA: Diagnosis not present

## 2023-08-05 DIAGNOSIS — E785 Hyperlipidemia, unspecified: Secondary | ICD-10-CM | POA: Diagnosis not present

## 2023-08-05 DIAGNOSIS — Z96652 Presence of left artificial knee joint: Secondary | ICD-10-CM | POA: Diagnosis not present

## 2023-08-05 DIAGNOSIS — I69354 Hemiplegia and hemiparesis following cerebral infarction affecting left non-dominant side: Secondary | ICD-10-CM | POA: Diagnosis not present

## 2023-08-05 DIAGNOSIS — J439 Emphysema, unspecified: Secondary | ICD-10-CM | POA: Diagnosis not present

## 2023-08-05 DIAGNOSIS — Z96642 Presence of left artificial hip joint: Secondary | ICD-10-CM | POA: Diagnosis not present

## 2023-08-05 DIAGNOSIS — I252 Old myocardial infarction: Secondary | ICD-10-CM | POA: Diagnosis not present

## 2023-08-05 DIAGNOSIS — I119 Hypertensive heart disease without heart failure: Secondary | ICD-10-CM | POA: Diagnosis not present

## 2023-08-13 DIAGNOSIS — I2510 Atherosclerotic heart disease of native coronary artery without angina pectoris: Secondary | ICD-10-CM | POA: Diagnosis not present

## 2023-08-13 DIAGNOSIS — I252 Old myocardial infarction: Secondary | ICD-10-CM | POA: Diagnosis not present

## 2023-08-13 DIAGNOSIS — Z96642 Presence of left artificial hip joint: Secondary | ICD-10-CM | POA: Diagnosis not present

## 2023-08-13 DIAGNOSIS — Z96652 Presence of left artificial knee joint: Secondary | ICD-10-CM | POA: Diagnosis not present

## 2023-08-13 DIAGNOSIS — I69354 Hemiplegia and hemiparesis following cerebral infarction affecting left non-dominant side: Secondary | ICD-10-CM | POA: Diagnosis not present

## 2023-08-13 DIAGNOSIS — E785 Hyperlipidemia, unspecified: Secondary | ICD-10-CM | POA: Diagnosis not present

## 2023-08-13 DIAGNOSIS — I119 Hypertensive heart disease without heart failure: Secondary | ICD-10-CM | POA: Diagnosis not present

## 2023-08-13 DIAGNOSIS — J439 Emphysema, unspecified: Secondary | ICD-10-CM | POA: Diagnosis not present

## 2023-08-14 DIAGNOSIS — I69354 Hemiplegia and hemiparesis following cerebral infarction affecting left non-dominant side: Secondary | ICD-10-CM | POA: Diagnosis not present

## 2023-08-14 DIAGNOSIS — Z96652 Presence of left artificial knee joint: Secondary | ICD-10-CM | POA: Diagnosis not present

## 2023-08-14 DIAGNOSIS — I119 Hypertensive heart disease without heart failure: Secondary | ICD-10-CM | POA: Diagnosis not present

## 2023-08-14 DIAGNOSIS — I252 Old myocardial infarction: Secondary | ICD-10-CM | POA: Diagnosis not present

## 2023-08-14 DIAGNOSIS — E785 Hyperlipidemia, unspecified: Secondary | ICD-10-CM | POA: Diagnosis not present

## 2023-08-14 DIAGNOSIS — Z96642 Presence of left artificial hip joint: Secondary | ICD-10-CM | POA: Diagnosis not present

## 2023-08-14 DIAGNOSIS — J439 Emphysema, unspecified: Secondary | ICD-10-CM | POA: Diagnosis not present

## 2023-08-14 DIAGNOSIS — I2510 Atherosclerotic heart disease of native coronary artery without angina pectoris: Secondary | ICD-10-CM | POA: Diagnosis not present

## 2023-08-16 ENCOUNTER — Other Ambulatory Visit: Payer: Self-pay | Admitting: Nurse Practitioner

## 2023-08-18 DIAGNOSIS — Z96652 Presence of left artificial knee joint: Secondary | ICD-10-CM | POA: Diagnosis not present

## 2023-08-18 DIAGNOSIS — I2510 Atherosclerotic heart disease of native coronary artery without angina pectoris: Secondary | ICD-10-CM | POA: Diagnosis not present

## 2023-08-18 DIAGNOSIS — I252 Old myocardial infarction: Secondary | ICD-10-CM | POA: Diagnosis not present

## 2023-08-18 DIAGNOSIS — J439 Emphysema, unspecified: Secondary | ICD-10-CM | POA: Diagnosis not present

## 2023-08-18 DIAGNOSIS — E785 Hyperlipidemia, unspecified: Secondary | ICD-10-CM | POA: Diagnosis not present

## 2023-08-18 DIAGNOSIS — I69354 Hemiplegia and hemiparesis following cerebral infarction affecting left non-dominant side: Secondary | ICD-10-CM | POA: Diagnosis not present

## 2023-08-18 DIAGNOSIS — Z96642 Presence of left artificial hip joint: Secondary | ICD-10-CM | POA: Diagnosis not present

## 2023-08-18 DIAGNOSIS — I119 Hypertensive heart disease without heart failure: Secondary | ICD-10-CM | POA: Diagnosis not present

## 2023-08-20 DIAGNOSIS — I252 Old myocardial infarction: Secondary | ICD-10-CM | POA: Diagnosis not present

## 2023-08-20 DIAGNOSIS — I2510 Atherosclerotic heart disease of native coronary artery without angina pectoris: Secondary | ICD-10-CM | POA: Diagnosis not present

## 2023-08-20 DIAGNOSIS — I69354 Hemiplegia and hemiparesis following cerebral infarction affecting left non-dominant side: Secondary | ICD-10-CM | POA: Diagnosis not present

## 2023-08-20 DIAGNOSIS — I119 Hypertensive heart disease without heart failure: Secondary | ICD-10-CM | POA: Diagnosis not present

## 2023-08-20 DIAGNOSIS — J439 Emphysema, unspecified: Secondary | ICD-10-CM | POA: Diagnosis not present

## 2023-08-20 DIAGNOSIS — E785 Hyperlipidemia, unspecified: Secondary | ICD-10-CM | POA: Diagnosis not present

## 2023-08-20 DIAGNOSIS — Z96642 Presence of left artificial hip joint: Secondary | ICD-10-CM | POA: Diagnosis not present

## 2023-08-20 DIAGNOSIS — Z96652 Presence of left artificial knee joint: Secondary | ICD-10-CM | POA: Diagnosis not present

## 2023-08-21 ENCOUNTER — Encounter: Payer: Self-pay | Admitting: Nurse Practitioner

## 2023-08-21 ENCOUNTER — Other Ambulatory Visit: Payer: 59

## 2023-08-21 ENCOUNTER — Ambulatory Visit: Payer: 59 | Attending: Nurse Practitioner | Admitting: Nurse Practitioner

## 2023-08-21 ENCOUNTER — Other Ambulatory Visit: Payer: Self-pay | Admitting: Nurse Practitioner

## 2023-08-21 ENCOUNTER — Telehealth: Payer: Self-pay | Admitting: Nurse Practitioner

## 2023-08-21 VITALS — BP 142/72 | HR 80 | Ht 69.0 in | Wt 203.0 lb

## 2023-08-21 DIAGNOSIS — Z720 Tobacco use: Secondary | ICD-10-CM | POA: Diagnosis not present

## 2023-08-21 DIAGNOSIS — Z8673 Personal history of transient ischemic attack (TIA), and cerebral infarction without residual deficits: Secondary | ICD-10-CM | POA: Diagnosis not present

## 2023-08-21 DIAGNOSIS — R55 Syncope and collapse: Secondary | ICD-10-CM

## 2023-08-21 DIAGNOSIS — I2510 Atherosclerotic heart disease of native coronary artery without angina pectoris: Secondary | ICD-10-CM

## 2023-08-21 DIAGNOSIS — Z79899 Other long term (current) drug therapy: Secondary | ICD-10-CM

## 2023-08-21 DIAGNOSIS — E785 Hyperlipidemia, unspecified: Secondary | ICD-10-CM

## 2023-08-21 DIAGNOSIS — I10 Essential (primary) hypertension: Secondary | ICD-10-CM

## 2023-08-21 DIAGNOSIS — F1010 Alcohol abuse, uncomplicated: Secondary | ICD-10-CM

## 2023-08-21 NOTE — Progress Notes (Signed)
Call placed to patient - he confirms that he is currently taking Atorvastain 80mg  daily.  Will add to medication list.

## 2023-08-21 NOTE — Addendum Note (Signed)
Addended by: Lesle Chris on: 08/21/2023 04:28 PM   Modules accepted: Orders

## 2023-08-21 NOTE — Progress Notes (Signed)
Cardiology Office Note:  .   Date:  08/21/2023 ID:  Bryan Levy, DOB July 22, 1950, MRN [id number] PCP: Toma Deiters, MD  [facility] Providers Cardiologist:  Dina Rich, MD    History of Present Illness: .   Bryan Levy is a 73 y.o. male with a PMH of CAD, hypertension, hep C, past history of GI bleed, type 2 diabetes, hyperlipidemia, COPD, history of tobacco use, GERD, and hx of recent CVA, history of syncope and collapse, who presents today for 4-6 week follow-up.  Last seen by Dr. Dina Rich on January 31, 2022.  Overall he was doing well at the time.  Last seen for 1 year follow-up on May 25, 2023. He was not sure what medication he was on. CMA Sharen Hones ) called patient's pharmacy and med list updated. Was overall doing well from a cardiac perspective and denied any acute cardiac complaints. Denied any chest pain, shortness of breath, palpitations, syncope, presyncope, dizziness, orthopnea, PND, swelling or significant weight changes, acute bleeding, or claudication.  Recent hospital admission at [facility] for acute CVA. CT head and CTA head and neck were largely unremarkable, however MRI was ordered. 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   Today he presents for follow-up. He states he is scheduled to have upcoming therapy arranged after his stroke. Left arm continues to be weak after his stroke and has tongue deviation to the left side, says he has been doing tongue exercises to help with this. Admits to left hip pain. Denies any chest pain and says he is breathing better and tells me he has stopped smoking. BP is much better controlled per his report. Says his BP recently at home has been 126/81. Denies any chest pain, shortness of breath, palpitations, syncope, presyncope, dizziness, orthopnea, PND, swelling or significant weight changes, acute bleeding, or claudication.   Since  I last saw patient, he presented to [facility] on July 26, 2023 for syncopal episode, said he had a fall.  Patient reported he was walking to the bathroom when he fell.  He stated he remembered falling, denied any specific symptom before event.  Patient felt like it was related to his left sided weakness from his stroke.  Patient felt that he struck his head because he woke up on floor with head against the wall.  EMS stated patient was unconscious when they arrived.  In the ED, he was found to have mildly elevated serum creatinine and EtOH level of 149.  CT of the head and spine were unremarkable.  CXR WNL.  Fall was felt to be due to multifactorial in etiology.  Home health PT recommended.  08/21/2023 - Today he presents for follow-up.  He tells me he is no longer had any recurrent syncopal events since hospital discharge.  Tells me he has returned to smoking.  He is smoking around 10 to 12 cigarettes/day, planning to wean off this.  He does admit to drinking 1/5 alcohol on the weekend in addition to a sixpack of beer.  Says he is thinking about joining AA. Denies any chest pain, shortness of breath, palpitations, syncope, presyncope, dizziness, orthopnea, PND, swelling or significant weight changes, acute bleeding, or claudication.  ROS: Negative. See HPI.   Studies Reviewed: Marland Kitchen    EKG: EKG is not ordered today.   Echo 07/07/2023 at [facility]:  Summary  1. The left ventricle is normal  in size with upper normal wall thickness.  2. The left ventricular systolic function is normal, LVEF is visually estimated at > 55%.  3. Mitral annular calcification is present.  4. The right ventricle is normal in size, with normal systolic function.  5. There is no evidence of an interatrial flow communication or intrapulmonary shunt by agitated saline study.   Echo 02/2017:  Study Conclusions   - Left ventricle: The cavity size was normal. Wall thickness was    normal. Systolic function was normal.  The estimated ejection    fraction was in the range of 55% to 60%. Wall motion was normal;    there were no regional wall motion abnormalities. Doppler    parameters are consistent with abnormal left ventricular    relaxation (grade 1 diastolic dysfunction).  - Aortic valve: Valve area (VTI): 2.74 cm^2. Valve area (Vmax):    2.42 cm^2. Valve area (Vmean): 2.36 cm^2.  - Technically adequate study.  Lexiscan 02/2017:  Inferior/inferolateral defect that improves incompletely consistent with ischemia in inferolateral wall (base, minimally mid) and scar. Coexistent soft tissue attenuation (diaphragm, overlying bowel activity) makes evaluation difficult This is a high risk study. Nuclear stress EF: 38%.  Physical Exam:   VS:  BP (!) 142/72   Pulse 80   Ht 5\' 9"  (1.753 m)   Wt 203 lb (92.1 kg)   SpO2 97%   BMI 29.98 kg/m    Wt Readings from Last 3 Encounters:  08/21/23 203 lb (92.1 kg)  07/10/23 201 lb (91.2 kg)  06/29/23 206 lb 12.8 oz (93.8 kg)    GEN: Well nourished, well developed in no acute distress NECK: No JVD; No carotid bruits CARDIAC: S1/S2, RRR, no murmurs, rubs, gallops RESPIRATORY:  Clear to auscultation without rales, wheezing or rhonchi  ABDOMEN: Soft, non-tender, non-distended EXTREMITIES:  No edema; No deformity   ASSESSMENT AND PLAN: .    Syncope and collapse Most likely in the setting of alcohol abuse.  Denies any recurrent syncopal episodes since event.  Denies any chest pain, shortness of breath or any concerning symptoms surrounding event.  Will arrange 14-day live ZIO AT monitor to evaluate for any arrhythmias.  Will obtain CMET.  Advised him not to drive for at least 6 months.  He verbalized understanding.  No other medication changes at this time.  Care and ED precautions discussed.  HTN BP mildly elevated today. Discussed to monitor BP at home at least 2 hours after medications and sitting for 5-10 minutes. Previously given BP log and salty six. Continue  current medication regimen. Care and ED precautions discussed. Heart healthy diet and regular cardiovascular exercise encouraged.  If no improvement with BP by next follow-up visit, plan to increase amlodipine.  CAD Hx of NSTEMI in 2011. In 2018, underwent NST that showed mild inferolateral ischemia and was felt that degree of ischemia would still represent to be low risk.  Echocardiogram at the time revealed normal EF, no wall motion abnormalities.  CT chest in 2021 revealed CAD. Stable with no anginal symptoms. No indication for ischemic evaluation. No med changes at this time. Care and ED precautions discussed. Heart healthy diet and regular cardiovascular exercise encouraged.    HLD, medication management No recent labs on file.  Will obtain CMET and FLP at this time.  Continue atorvastatin, do not see this still listed on med list, will verify again with patient.  Heart healthy diet and regular cardiovascular exercise encouraged.   4.  Hx of CVA Doing  well. Previously offered/recommended neurology referral, however pt declines. Continue to follow with PCP. Care and ED precautions discussed.   5.  Tobacco abuse, alcohol abuse Smoking cessation encouraged and discussed.  Discussed about risks regarding alcohol abuse. Given information regarding AA.   Dispo: Follow-up with me/APP in 6-8 weeks or sooner anything changes.   Signed, Sharlene Dory, NP

## 2023-08-21 NOTE — Telephone Encounter (Signed)
 Checking percert on the following   LONG TERM MONITOR-LIVE TELEMETRY (3-14 DAYS)

## 2023-08-21 NOTE — Patient Instructions (Addendum)
Medication Instructions:   Continue all current medications.   Labwork:  FLP, CMET - orders given today Please do in 3 weeks Office will contact with results via phone, letter or mychart.    Testing/Procedures:  Your physician has recommended that you wear a 14 day event monitor. Event monitors are medical devices that record the heart's electrical activity. Doctors most often [place] these monitors to diagnose arrhythmias. Arrhythmias are problems with the speed or rhythm of the heartbeat. The monitor is a small, portable device. You can wear one while you do your normal daily activities. This is usually used to diagnose what is causing palpitations/syncope (passing out). Office will contact with results via phone, letter or mychart.     Follow-Up:  6-8 weeks   Any Other Special Instructions Will Be Listed Below (If Applicable).  Smoking cessation info given today  If you need a refill on your cardiac medications before your next appointment, please call your pharmacy.

## 2023-08-28 ENCOUNTER — Telehealth: Payer: Self-pay | Admitting: Nurse Practitioner

## 2023-08-28 DIAGNOSIS — Z96642 Presence of left artificial hip joint: Secondary | ICD-10-CM | POA: Diagnosis not present

## 2023-08-28 DIAGNOSIS — E785 Hyperlipidemia, unspecified: Secondary | ICD-10-CM | POA: Diagnosis not present

## 2023-08-28 DIAGNOSIS — I252 Old myocardial infarction: Secondary | ICD-10-CM | POA: Diagnosis not present

## 2023-08-28 DIAGNOSIS — I2510 Atherosclerotic heart disease of native coronary artery without angina pectoris: Secondary | ICD-10-CM | POA: Diagnosis not present

## 2023-08-28 DIAGNOSIS — J439 Emphysema, unspecified: Secondary | ICD-10-CM | POA: Diagnosis not present

## 2023-08-28 DIAGNOSIS — I6381 Other cerebral infarction due to occlusion or stenosis of small artery: Secondary | ICD-10-CM | POA: Diagnosis not present

## 2023-08-28 DIAGNOSIS — I69354 Hemiplegia and hemiparesis following cerebral infarction affecting left non-dominant side: Secondary | ICD-10-CM | POA: Diagnosis not present

## 2023-08-28 DIAGNOSIS — R55 Syncope and collapse: Secondary | ICD-10-CM | POA: Diagnosis not present

## 2023-08-28 DIAGNOSIS — I10 Essential (primary) hypertension: Secondary | ICD-10-CM | POA: Diagnosis not present

## 2023-08-28 DIAGNOSIS — Z96652 Presence of left artificial knee joint: Secondary | ICD-10-CM | POA: Diagnosis not present

## 2023-08-28 DIAGNOSIS — I119 Hypertensive heart disease without heart failure: Secondary | ICD-10-CM | POA: Diagnosis not present

## 2023-08-28 NOTE — Telephone Encounter (Signed)
 Patient called requesting to see if someone could help him with his heart monitor.

## 2023-08-28 NOTE — Telephone Encounter (Signed)
 Advised to come to office today before 3:30 pm to get assistance putting on monitor.  Verbalized understanding.

## 2023-08-29 DIAGNOSIS — R55 Syncope and collapse: Secondary | ICD-10-CM | POA: Diagnosis not present

## 2023-09-03 DIAGNOSIS — I2510 Atherosclerotic heart disease of native coronary artery without angina pectoris: Secondary | ICD-10-CM | POA: Diagnosis not present

## 2023-09-03 DIAGNOSIS — Z96642 Presence of left artificial hip joint: Secondary | ICD-10-CM | POA: Diagnosis not present

## 2023-09-03 DIAGNOSIS — I69354 Hemiplegia and hemiparesis following cerebral infarction affecting left non-dominant side: Secondary | ICD-10-CM | POA: Diagnosis not present

## 2023-09-03 DIAGNOSIS — I252 Old myocardial infarction: Secondary | ICD-10-CM | POA: Diagnosis not present

## 2023-09-03 DIAGNOSIS — I119 Hypertensive heart disease without heart failure: Secondary | ICD-10-CM | POA: Diagnosis not present

## 2023-09-03 DIAGNOSIS — J439 Emphysema, unspecified: Secondary | ICD-10-CM | POA: Diagnosis not present

## 2023-09-03 DIAGNOSIS — Z96652 Presence of left artificial knee joint: Secondary | ICD-10-CM | POA: Diagnosis not present

## 2023-09-03 DIAGNOSIS — E785 Hyperlipidemia, unspecified: Secondary | ICD-10-CM | POA: Diagnosis not present

## 2023-09-14 DIAGNOSIS — I2510 Atherosclerotic heart disease of native coronary artery without angina pectoris: Secondary | ICD-10-CM | POA: Diagnosis not present

## 2023-09-25 DIAGNOSIS — E1169 Type 2 diabetes mellitus with other specified complication: Secondary | ICD-10-CM | POA: Diagnosis not present

## 2023-10-02 ENCOUNTER — Encounter: Payer: Self-pay | Admitting: Nurse Practitioner

## 2023-10-02 ENCOUNTER — Ambulatory Visit: Payer: 59 | Attending: Nurse Practitioner | Admitting: Nurse Practitioner

## 2023-10-02 VITALS — BP 114/62 | HR 72 | Ht 69.0 in | Wt 204.0 lb

## 2023-10-02 DIAGNOSIS — I10 Essential (primary) hypertension: Secondary | ICD-10-CM

## 2023-10-02 DIAGNOSIS — Z87898 Personal history of other specified conditions: Secondary | ICD-10-CM | POA: Diagnosis not present

## 2023-10-02 DIAGNOSIS — Z8673 Personal history of transient ischemic attack (TIA), and cerebral infarction without residual deficits: Secondary | ICD-10-CM | POA: Diagnosis not present

## 2023-10-02 DIAGNOSIS — Z87891 Personal history of nicotine dependence: Secondary | ICD-10-CM

## 2023-10-02 DIAGNOSIS — I2510 Atherosclerotic heart disease of native coronary artery without angina pectoris: Secondary | ICD-10-CM | POA: Diagnosis not present

## 2023-10-02 DIAGNOSIS — F1011 Alcohol abuse, in remission: Secondary | ICD-10-CM

## 2023-10-02 DIAGNOSIS — E785 Hyperlipidemia, unspecified: Secondary | ICD-10-CM

## 2023-10-02 NOTE — Progress Notes (Signed)
 Cardiology Office Note:  .   Date:  10/02/2023 ID:  Bryan Levy, DOB [birth year], MRN [id number] PCP: Cindra Eves, FNP  [facility] Providers Cardiologist:  Dina Rich, MD    History of Present Illness: .   Bryan Levy is a 73 y.o. male with a PMH of CAD, hypertension, hep C, past history of GI bleed, type 2 diabetes, hyperlipidemia, COPD, history of tobacco use, GERD, and hx of recent CVA, history of syncope and collapse, who presents today for scheduled follow-up.  Last seen by Dr. Dina Rich on January 31, 2022.  Overall he was doing well at the time.  Last seen for 1 year follow-up on May 25, 2023. He was not sure what medication he was on. CMA Sharen Hones ) called patient's pharmacy and med list updated. Was overall doing well from a cardiac perspective and denied any acute cardiac complaints. Denied any chest pain, shortness of breath, palpitations, syncope, presyncope, dizziness, orthopnea, PND, swelling or significant weight changes, acute bleeding, or claudication.  Recent hospital admission at [facility] for acute CVA. CT head and CTA head and neck were largely unremarkable, however MRI was ordered. 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   Today he presents for follow-up. He states he is scheduled to have upcoming therapy arranged after his stroke. Left arm continues to be weak after his stroke and has tongue deviation to the left side, says he has been doing tongue exercises to help with this. Admits to left hip pain. Denies any chest pain and says he is breathing better and tells me he has stopped smoking. BP is much better controlled per his report. Says his BP recently at home has been 126/81. Denies any chest pain, shortness of breath, palpitations, syncope, presyncope, dizziness, orthopnea, PND, swelling or significant weight changes, acute bleeding, or claudication.    Since I last saw patient, he presented to [facility] on July 26, 2023 for syncopal episode, said he had a fall.  Patient reported he was walking to the bathroom when he fell.  He stated he remembered falling, denied any specific symptom before event.  Patient felt like it was related to his left sided weakness from his stroke.  Patient felt that he struck his head because he woke up on floor with head against the wall.  EMS stated patient was unconscious when they arrived.  In the ED, he was found to have mildly elevated serum creatinine and EtOH level of 149.  CT of the head and spine were unremarkable.  CXR WNL.  Fall was felt to be due to multifactorial in etiology.  Home health PT recommended.  08/21/2023 - Today he presents for follow-up.  He tells me he is no longer had any recurrent syncopal events since hospital discharge.  Tells me he has returned to smoking.  He is smoking around 10 to 12 cigarettes/day, planning to wean off this.  He does admit to drinking 1/5 alcohol on the weekend in addition to a sixpack of beer.  Says he is thinking about joining AA. Denies any chest pain, shortness of breath, palpitations, syncope, presyncope, dizziness, orthopnea, PND, swelling or significant weight changes, acute bleeding, or claudication.  10/02/2023 - Today he presents for follow-up. Denies any recurrent syncopal episodes but admits to some brief episodes of chest pain, difficult for him to describe, not bothersome to him. Denies any specific triggers or  exertional component. Denies any shortness of breath, palpitations, syncope, presyncope, dizziness, orthopnea, PND, swelling or significant weight changes, acute bleeding, or claudication. Tells me he has quit smoking and drinking alcohol.   ROS: Negative. See HPI.   Studies Reviewed: Marland Kitchen    EKG: EKG is not ordered today.   Cardiac monitor (preliminary report) 08/2023: Patch Wear Time:  13 days and 15 hours (2025-02-07T15:22:30-0500 to  2025-02-21T07:16:59-0500)   Patient had a min HR of 55 bpm, max HR of 171 bpm, and avg HR of 88 bpm. Predominant underlying rhythm was Sinus Rhythm. Slight P wave morphology changes were noted. 4 Supraventricular Tachycardia runs occurred, the run with the fastest interval lasting  15 beats with a max rate of 171 bpm (avg 149 bpm); the run with the fastest interval was also the longest. Isolated SVEs were rare (<1.0%, 6541), SVE Couplets were rare (<1.0%, 58), and SVE Triplets were rare (<1.0%, 13). Isolated VEs were rare (<1.0%),  VE Couplets were rare (<1.0%), and no VE Triplets were present.  Echo 07/07/2023 at [facility]:  Summary  1. The left ventricle is normal in size with upper normal wall thickness.  2. The left ventricular systolic function is normal, LVEF is visually estimated at > 55%.  3. Mitral annular calcification is present.  4. The right ventricle is normal in size, with normal systolic function.  5. There is no evidence of an interatrial flow communication or intrapulmonary shunt by agitated saline study.   Echo 02/2017:  Study Conclusions   - Left ventricle: The cavity size was normal. Wall thickness was    normal. Systolic function was normal. The estimated ejection    fraction was in the range of 55% to 60%. Wall motion was normal;    there were no regional wall motion abnormalities. Doppler    parameters are consistent with abnormal left ventricular    relaxation (grade 1 diastolic dysfunction).  - Aortic valve: Valve area (VTI): 2.74 cm^2. Valve area (Vmax):    2.42 cm^2. Valve area (Vmean): 2.36 cm^2.  - Technically adequate study.  Lexiscan 02/2017:  Inferior/inferolateral defect that improves incompletely consistent with ischemia in inferolateral wall (base, minimally mid) and scar. Coexistent soft tissue attenuation (diaphragm, overlying bowel activity) makes evaluation difficult This is a high risk study. Nuclear stress EF: 38%.  Physical Exam:   VS:   BP 114/62   Pulse 72   Ht 5\' 9"  (1.753 m)   Wt 204 lb (92.5 kg)   SpO2 95%   BMI 30.13 kg/m    Wt Readings from Last 3 Encounters:  10/02/23 204 lb (92.5 kg)  08/21/23 203 lb (92.1 kg)  07/10/23 201 lb (91.2 kg)    GEN: Well nourished, well developed in no acute distress NECK: No JVD; No carotid bruits CARDIAC: S1/S2, RRR, no murmurs, rubs, gallops RESPIRATORY:  Clear to auscultation without rales, wheezing or rhonchi  ABDOMEN: Soft, non-tender, non-distended EXTREMITIES:  No edema; No deformity   ASSESSMENT AND PLAN: .    Hx of syncope and collapse Most likely in the setting of alcohol abuse.  Denies any recurrent syncopal episodes since event.  Denies any chest pain, shortness of breath or any concerning symptoms surrounding event.  Most recent monitor did not show any significant arrhthymias to explain his syncope.  Advised him not to drive for at least 6 months.  He verbalized understanding.  No other medication changes at this time.  Care and ED precautions discussed.   HTN BP stable. Discussed to  monitor BP at home at least 2 hours after medications and sitting for 5-10 minutes. Previously given BP log and salty six. Continue current medication regimen. Care and ED precautions discussed. Heart healthy diet and regular cardiovascular exercise encouraged.   CAD Hx of NSTEMI in 2011. In 2018, underwent NST that showed mild inferolateral ischemia and was felt that degree of ischemia would still represent to be low risk.  Echocardiogram at the time revealed normal EF, no wall motion abnormalities.  CT chest in 2021 revealed CAD. Stable with no anginal symptoms. No indication for ischemic evaluation. No med changes at this time. Care and ED precautions discussed. Heart healthy diet and regular cardiovascular exercise encouraged.    HLD LDL 39 08/2023. Continue atorvastatin. Heart healthy diet and regular cardiovascular exercise encouraged.   4.  Hx of CVA Doing well. Previously  offered/recommended neurology referral, however pt declined. Continue to follow with PCP. Care and ED precautions discussed.   5.  Hx of tobacco abuse, alcohol abuse He has quit smoking and drinking alcohol. Congratulated him on this accomplishment.   Dispo: Follow-up with Dr. Dina Rich or APP in 6 months or sooner anything changes.   Signed, Sharlene Dory, NP

## 2023-10-02 NOTE — Patient Instructions (Signed)

## 2023-12-06 DIAGNOSIS — R55 Syncope and collapse: Secondary | ICD-10-CM | POA: Diagnosis not present

## 2023-12-21 ENCOUNTER — Telehealth: Payer: Self-pay

## 2023-12-21 NOTE — Telephone Encounter (Signed)
 I called patient to confirm if PSA labs were completed, pt scheduled for follow up on 06/04.  I left vm requesting a call back to confirm labs, if no labs pt will need to be rescheduled.

## 2023-12-23 ENCOUNTER — Ambulatory Visit: Payer: 59 | Admitting: Urology

## 2023-12-23 ENCOUNTER — Encounter: Payer: Self-pay | Admitting: Urology

## 2023-12-23 VITALS — BP 113/70 | HR 77

## 2023-12-23 DIAGNOSIS — N401 Enlarged prostate with lower urinary tract symptoms: Secondary | ICD-10-CM

## 2023-12-23 DIAGNOSIS — R972 Elevated prostate specific antigen [PSA]: Secondary | ICD-10-CM | POA: Diagnosis not present

## 2023-12-23 DIAGNOSIS — N138 Other obstructive and reflux uropathy: Secondary | ICD-10-CM

## 2023-12-23 DIAGNOSIS — R350 Frequency of micturition: Secondary | ICD-10-CM

## 2023-12-23 LAB — URINALYSIS, ROUTINE W REFLEX MICROSCOPIC
Bilirubin, UA: NEGATIVE
Glucose, UA: NEGATIVE
Ketones, UA: NEGATIVE
Leukocytes,UA: NEGATIVE
Nitrite, UA: NEGATIVE
Protein,UA: NEGATIVE
RBC, UA: NEGATIVE
Specific Gravity, UA: 1.01 (ref 1.005–1.030)
Urobilinogen, Ur: 0.2 mg/dL (ref 0.2–1.0)
pH, UA: 6 (ref 5.0–7.5)

## 2023-12-23 MED ORDER — SILODOSIN 8 MG PO CAPS: 8.0000 mg | ORAL_CAPSULE | Freq: Every day | ORAL | 3 refills | Status: DC

## 2023-12-23 NOTE — Patient Instructions (Signed)

## 2023-12-23 NOTE — Progress Notes (Signed)
 12/23/2023 2:31 PM   Bryan Levy July 23, 1950 [id number]  Referring provider: Veda Gerald, MD [address] [place],  [place] [address]  Followup BPH   HPI: Bryan Levy is a 72yo here for followup for BPh with urinary frequency and elevated PSA. No recent PSA. IPSS 8 QOL 2 on rapaflo  8mg  daily. Urine stream strong. NO straining to urinate. Nocturia 0-1x.  Urinary frequency every 3-4 hours. No incontinence. No post void dribbling   PMH: Past Medical History:  Diagnosis Date   Ankle dislocation, left, initial encounter 09/12/2016   Chest pain, unspecified    Closed posterior dislocation of left hip (HCC) 09/12/2016   GERD (gastroesophageal reflux disease)    Heart attack (HCC) 2008   Hyperlipidemia    Hypertension    Intermediate coronary syndrome (HCC) 04/23/2017   pt believes related to panic attack   Personal history of tobacco use, presenting hazards to health    Polysubstance abuse (HCC)    Shortness of breath     Surgical History: Past Surgical History:  Procedure Laterality Date   CATARACT EXTRACTION W/PHACO Right 05/12/2022   Procedure: CATARACT EXTRACTION PHACO AND INTRAOCULAR LENS PLACEMENT (IOC);  Surgeon: Tarri Farm, MD;  Location: [facility];  Service: Ophthalmology;  Laterality: Right;  CDE 9.25   CATARACT EXTRACTION W/PHACO Left 06/06/2022   Procedure: CATARACT EXTRACTION PHACO AND INTRAOCULAR LENS PLACEMENT (IOC);  Surgeon: Tarri Farm, MD;  Location: [facility];  Service: Ophthalmology;  Laterality: Left;  CDE: 9.87   COLONOSCOPY  11/2018   Dr. Louanne Roussel: severe diverticulosis with associated muscular hypertrophy. sessile polyp 3-63mm removed from ascending colon (tubular adenoma). next TCS in five year.    ESOPHAGOGASTRODUODENOSCOPY  2018   [facility]: erosive reflux esophagitis, potential MW tear, H.pylori gastritis. Eradication documented per breath test in 2020   IRRIGATION AND DEBRIDEMENT KNEE Left 09/11/2016   Procedure: IRRIGATION AND DEBRIDEMENT LEFT  KNEE;  Surgeon: Hardy Lia, MD;  Location: [facility] OR;  Service: Orthopedics;  Laterality: Left;   ORIF ACETABULAR FRACTURE Left 09/11/2016   Procedure: OPEN REDUCTION INTERNAL FIXATION (ORIF) ACETABULAR FRACTURE;  Surgeon: Hardy Lia, MD;  Location: [facility] OR;  Service: Orthopedics;  Laterality: Left;   ORIF ANKLE FRACTURE Left 09/11/2016   Procedure: OPEN REDUCTION INTERNAL FIXATION (ORIF) LEFT ANKLE FRACTURE;  Surgeon: Hardy Lia, MD;  Location: [facility] OR;  Service: Orthopedics;  Laterality: Left;   TONSILLECTOMY     TOTAL HIP ARTHROPLASTY Left 05/04/2017   Procedure: LEFT TOTAL HIP ARTHROPLASTY ANTERIOR APPROACH;  Surgeon: Wendolyn Hamburger, MD;  [facility]: [facility] OR;  Service: Orthopedics;  Laterality: Left;    Home Medications:  Allergies as of 12/23/2023       Reactions   Lisinopril Swelling        Medication List        Accurate as of December 23, 2023  2:31 PM. If you have any questions, ask your nurse or doctor.          amLODipine  5 MG tablet Commonly known as: NORVASC  Take 5 mg by mouth daily.   Aspirin  81 MG Caps Take 1 tablet by mouth daily.   atorvastatin 80 MG tablet Commonly known as: LIPITOR Take 1 tablet by mouth daily.   Blood Pressure Monitor Devi 1 each by Does not apply route daily.   carvedilol  25 MG tablet Commonly known as: COREG  Take 12.5 mg by mouth 2 (two) times daily with a meal.   ezetimibe  10 MG tablet Commonly known as: ZETIA  TAKE 1 TABLET  BY MOUTH DAILY   fluticasone furoate-vilanterol 200-25 MCG/ACT Aepb Commonly known as: BREO ELLIPTA Inhale into the lungs.   glipiZIDE 10 MG 24 hr tablet Commonly known as: GLUCOTROL XL Take 1 tablet by mouth daily.   methocarbamol  500 MG tablet Commonly known as: ROBAXIN  Take 500 mg by mouth as needed for muscle spasms.   olmesartan 40 MG tablet Commonly known as: BENICAR Take 40 mg by mouth daily.   pantoprazole  40 MG tablet Commonly known as: PROTONIX  Take 1 tablet by mouth daily.   silodosin  8  MG Caps capsule Commonly known as: RAPAFLO  Take 1 capsule (8 mg total) by mouth at bedtime.   Symbicort 160-4.5 MCG/ACT inhaler Generic drug: budesonide-formoterol   tamsulosin  0.4 MG Caps capsule Commonly known as: FLOMAX  TAKE 1 CAPSULE BY MOUTH ONCE  DAILY AFTER SUPPER   valsartan  160 MG tablet Commonly known as: DIOVAN  TAKE 1 TABLET BY MOUTH DAILY        Allergies:  Allergies  Allergen Reactions   Lisinopril Swelling    Family History: Family History  Problem Relation Age of Onset   Hypertension Mother    Hypertension Sister    Coronary artery disease Other        NEGATIVE     Social History:  reports that he has quit smoking. His smoking use included cigarettes. He has a 48 pack-year smoking history. He has never used smokeless tobacco. He reports that he does not currently use alcohol. He reports that he does not use drugs.  ROS: All other review of systems were reviewed and are negative except what is noted above in HPI  Physical Exam: BP 113/70   Pulse 77   Constitutional:  Alert and oriented, No acute distress. HEENT: [place] AT, moist mucus membranes.  Trachea midline, no masses. Cardiovascular: No clubbing, cyanosis, or edema. Respiratory: Normal respiratory effort, no increased work of breathing. GI: Abdomen is soft, nontender, nondistended, no abdominal masses GU: No CVA tenderness.  Lymph: No cervical or inguinal lymphadenopathy. Skin: No rashes, bruises or suspicious lesions. Neurologic: Grossly intact, no focal deficits, moving all 4 extremities. Psychiatric: Normal mood and affect.  Laboratory Data: Lab Results  Component Value Date   WBC 6.0 10/21/2017   HGB 13.3 10/21/2017   HCT 42.3 10/21/2017   MCV 82.6 10/21/2017   PLT 253 10/21/2017    Lab Results  Component Value Date   CREATININE 1.23 10/21/2017    No results found for: "PSA"  No results found for: "TESTOSTERONE"  Lab Results  Component Value Date   HGBA1C 7.4 09/23/2021     Urinalysis    Component Value Date/Time   COLORURINE YELLOW 04/23/2017 1329   APPEARANCEUR Clear 05/22/2023 1242   LABSPEC 1.018 04/23/2017 1329   PHURINE 6.0 04/23/2017 1329   GLUCOSEU Negative 05/22/2023 1242   HGBUR NEGATIVE 04/23/2017 1329   BILIRUBINUR Negative 05/22/2023 1242   KETONESUR NEGATIVE 04/23/2017 1329   PROTEINUR Negative 05/22/2023 1242   PROTEINUR NEGATIVE 04/23/2017 1329   NITRITE Negative 05/22/2023 1242   NITRITE NEGATIVE 04/23/2017 1329   LEUKOCYTESUR Negative 05/22/2023 1242    Lab Results  Component Value Date   LABMICR Comment 05/22/2023    Pertinent Imaging:  No results found for this or any previous visit.  No results found for this or any previous visit.  No results found for this or any previous visit.  No results found for this or any previous visit.  No results found for this or any previous visit.  No  results found for this or any previous visit.  No results found for this or any previous visit.  No results found for this or any previous visit.   Assessment & Plan:    1. Elevated PSA (Primary) -PSA today -followup 6 months with PSA - Urinalysis, Routine w reflex microscopic  2. Benign prostatic hyperplasia with urinary obstruction Continue rapaflo  8mg  daily  3. Urinary frequency Rapaflo  8mg  daily   No follow-ups on file.  Johnie Nailer, MD  [facility] [place]

## 2023-12-24 LAB — PSA, TOTAL AND FREE
PSA, Free Pct: 35.9 %
PSA, Free: 1.15 ng/mL
Prostate Specific Ag, Serum: 3.2 ng/mL (ref 0.0–4.0)

## 2023-12-29 ENCOUNTER — Ambulatory Visit: Payer: Self-pay

## 2023-12-31 DIAGNOSIS — E119 Type 2 diabetes mellitus without complications: Secondary | ICD-10-CM | POA: Diagnosis not present

## 2023-12-31 DIAGNOSIS — I10 Essential (primary) hypertension: Secondary | ICD-10-CM | POA: Diagnosis not present

## 2023-12-31 DIAGNOSIS — J439 Emphysema, unspecified: Secondary | ICD-10-CM | POA: Diagnosis not present

## 2023-12-31 DIAGNOSIS — F17210 Nicotine dependence, cigarettes, uncomplicated: Secondary | ICD-10-CM | POA: Diagnosis not present

## 2024-01-07 DIAGNOSIS — R918 Other nonspecific abnormal finding of lung field: Secondary | ICD-10-CM | POA: Diagnosis not present

## 2024-01-07 DIAGNOSIS — I2510 Atherosclerotic heart disease of native coronary artery without angina pectoris: Secondary | ICD-10-CM | POA: Diagnosis not present

## 2024-01-07 DIAGNOSIS — Z122 Encounter for screening for malignant neoplasm of respiratory organs: Secondary | ICD-10-CM | POA: Diagnosis not present

## 2024-01-07 DIAGNOSIS — Z87891 Personal history of nicotine dependence: Secondary | ICD-10-CM | POA: Diagnosis not present

## 2024-01-07 DIAGNOSIS — J439 Emphysema, unspecified: Secondary | ICD-10-CM | POA: Diagnosis not present

## 2024-01-07 DIAGNOSIS — F17210 Nicotine dependence, cigarettes, uncomplicated: Secondary | ICD-10-CM | POA: Diagnosis not present

## 2024-01-16 ENCOUNTER — Ambulatory Visit: Payer: Self-pay | Admitting: Nurse Practitioner

## 2024-02-01 ENCOUNTER — Other Ambulatory Visit: Payer: Self-pay | Admitting: Cardiology

## 2024-02-03 DIAGNOSIS — M5432 Sciatica, left side: Secondary | ICD-10-CM | POA: Diagnosis not present

## 2024-02-03 DIAGNOSIS — Z0000 Encounter for general adult medical examination without abnormal findings: Secondary | ICD-10-CM | POA: Diagnosis not present

## 2024-03-28 DIAGNOSIS — I10 Essential (primary) hypertension: Secondary | ICD-10-CM | POA: Diagnosis not present

## 2024-03-28 DIAGNOSIS — E785 Hyperlipidemia, unspecified: Secondary | ICD-10-CM | POA: Diagnosis not present

## 2024-03-28 DIAGNOSIS — I252 Old myocardial infarction: Secondary | ICD-10-CM | POA: Diagnosis not present

## 2024-03-28 DIAGNOSIS — E119 Type 2 diabetes mellitus without complications: Secondary | ICD-10-CM | POA: Diagnosis not present

## 2024-03-28 DIAGNOSIS — Z23 Encounter for immunization: Secondary | ICD-10-CM | POA: Diagnosis not present

## 2024-03-28 DIAGNOSIS — K649 Unspecified hemorrhoids: Secondary | ICD-10-CM | POA: Diagnosis not present

## 2024-03-31 ENCOUNTER — Ambulatory Visit: Attending: Cardiology | Admitting: Cardiology

## 2024-03-31 ENCOUNTER — Encounter: Payer: Self-pay | Admitting: Cardiology

## 2024-03-31 VITALS — BP 124/70 | HR 80 | Ht 69.0 in | Wt 198.6 lb

## 2024-03-31 DIAGNOSIS — I10 Essential (primary) hypertension: Secondary | ICD-10-CM | POA: Diagnosis not present

## 2024-03-31 DIAGNOSIS — I2510 Atherosclerotic heart disease of native coronary artery without angina pectoris: Secondary | ICD-10-CM | POA: Diagnosis not present

## 2024-03-31 DIAGNOSIS — E782 Mixed hyperlipidemia: Secondary | ICD-10-CM

## 2024-03-31 MED ORDER — VALSARTAN 160 MG PO TABS: 160.0000 mg | ORAL_TABLET | Freq: Every day | ORAL | 3 refills | Status: AC

## 2024-03-31 NOTE — Progress Notes (Signed)
 Clinical Summary Bryan Levy is a 73 y.o.male seen today for follow up of the following medical problems.     1. CAD - NSTEMI 03/2010, normal coronaries at that time. D-dimer negative.  - 02/2017 nuclear stress independently reviewed, mild inferolateral ischemia. Evaluation limted by adjacent gut uptake. Degree of ischemia if present would still represent low risk.  02/2017 echo: LVEF 55-60%, grade I diastolic dysfunction, no WMAs - 06/2020 CT chest CAD noted   - no chest pains. Chronic SOB he relates to his COPD - compliant with meds     2. HTN - complianwith meds     3. AAA screen - 08/2016 CT abd no AAA.    4. Hep C - followed by GI - completed treatment     5. History of GI bleed - followed by GI. From notes history of erosive reflux esophagitis     6. DM2 - HgbA1c was 6.6 by 05/2020 - diet controlled, followed by pcp   7. Hyperliidamia - 11/2021 TC 186 TG 89 HDL 47 LDL 123 - 08/2023 TC 93 TG 71 HDL 39 LDL 39   8. COPD - compliant with inhalers  9. History of syncope - admit Jan 2025 [facility] with syncope.  - from notes thought to be secondary to EtOH abuse. High EtOH level by labs. Other workup benign - cardiac monitor was benign - no recurrent episodes  10. Histoy of CVA     Past Medical History:  Diagnosis Date   Ankle dislocation, left, initial encounter 09/12/2016   Chest pain, unspecified    Closed posterior dislocation of left hip (HCC) 09/12/2016   GERD (gastroesophageal reflux disease)    Heart attack (HCC) 2008   Hyperlipidemia    Hypertension    Intermediate coronary syndrome (HCC) 04/23/2017   pt believes related to panic attack   Personal history of tobacco use, presenting hazards to health    Polysubstance abuse (HCC)    Shortness of breath      Allergies  Allergen Reactions   Lisinopril Swelling     Current Outpatient Medications  Medication Sig Dispense Refill   amLODipine  (NORVASC ) 5 MG tablet Take 5 mg by mouth daily.      Aspirin  81 MG CAPS Take 1 tablet by mouth daily.     atorvastatin (LIPITOR) 80 MG tablet Take 1 tablet by mouth daily.     Blood Pressure Monitor DEVI 1 each by Does not apply route daily. 1 each 0   carvedilol  (COREG ) 25 MG tablet Take 12.5 mg by mouth 2 (two) times daily with a meal.     ezetimibe  (ZETIA ) 10 MG tablet TAKE 1 TABLET BY MOUTH DAILY 100 tablet 2   fluticasone furoate-vilanterol (BREO ELLIPTA) 200-25 MCG/ACT AEPB Inhale into the lungs.     glipiZIDE (GLUCOTROL XL) 10 MG 24 hr tablet Take 1 tablet by mouth daily.     methocarbamol  (ROBAXIN ) 500 MG tablet Take 500 mg by mouth as needed for muscle spasms.     olmesartan (BENICAR) 40 MG tablet Take 40 mg by mouth daily.     pantoprazole  (PROTONIX ) 40 MG tablet Take 1 tablet by mouth daily.     silodosin  (RAPAFLO ) 8 MG CAPS capsule Take 1 capsule (8 mg total) by mouth at bedtime. 100 capsule 3   SYMBICORT 160-4.5 MCG/ACT inhaler      valsartan  (DIOVAN ) 160 MG tablet TAKE 1 TABLET BY MOUTH DAILY 100 tablet 2   No current facility-administered medications for  this visit.     Past Surgical History:  Procedure Laterality Date   CATARACT EXTRACTION W/PHACO Right 05/12/2022   Procedure: CATARACT EXTRACTION PHACO AND INTRAOCULAR LENS PLACEMENT (IOC);  Surgeon: Harrie Agent, MD;  Location: [facility];  Service: Ophthalmology;  Laterality: Right;  CDE 9.25   CATARACT EXTRACTION W/PHACO Left 06/06/2022   Procedure: CATARACT EXTRACTION PHACO AND INTRAOCULAR LENS PLACEMENT (IOC);  Surgeon: Harrie Agent, MD;  Location: [facility];  Service: Ophthalmology;  Laterality: Left;  CDE: 9.87   COLONOSCOPY  11/2018   Dr. Maranda: severe diverticulosis with associated muscular hypertrophy. sessile polyp 3-49mm removed from ascending colon (tubular adenoma). next TCS in five year.    ESOPHAGOGASTRODUODENOSCOPY  2018   [facility]: erosive reflux esophagitis, potential MW tear, H.pylori gastritis. Eradication documented per breath test in 2020   IRRIGATION  AND DEBRIDEMENT KNEE Left 09/11/2016   Procedure: IRRIGATION AND DEBRIDEMENT LEFT KNEE;  Surgeon: Ozell Bruch, MD;  Location: [facility] OR;  Service: Orthopedics;  Laterality: Left;   ORIF ACETABULAR FRACTURE Left 09/11/2016   Procedure: OPEN REDUCTION INTERNAL FIXATION (ORIF) ACETABULAR FRACTURE;  Surgeon: Ozell Bruch, MD;  Location: [facility] OR;  Service: Orthopedics;  Laterality: Left;   ORIF ANKLE FRACTURE Left 09/11/2016   Procedure: OPEN REDUCTION INTERNAL FIXATION (ORIF) LEFT ANKLE FRACTURE;  Surgeon: Ozell Bruch, MD;  Location: [facility] OR;  Service: Orthopedics;  Laterality: Left;   TONSILLECTOMY     TOTAL HIP ARTHROPLASTY Left 05/04/2017   Procedure: LEFT TOTAL HIP ARTHROPLASTY ANTERIOR APPROACH;  Surgeon: Liam Lerner, MD;  Location: [facility] OR;  Service: Orthopedics;  Laterality: Left;     Allergies  Allergen Reactions   Lisinopril Swelling      Family History  Problem Relation Age of Onset   Hypertension Mother    Hypertension Sister    Coronary artery disease Other        NEGATIVE      Social History Bryan Levy reports that he has quit smoking. His smoking use included cigarettes. He has a 48 pack-year smoking history. He has never used smokeless tobacco. Bryan Levy reports that he does not currently use alcohol.     Physical Examination Today's Vitals   03/31/24 1144  BP: 124/70  Pulse: 80  SpO2: 96%  Weight: 198 lb 9.6 oz (90.1 kg)  Height: 5' 9 (1.753 m)   Body mass index is 29.33 kg/m.  Gen: resting comfortably, no acute distress HEENT: no scleral icterus, pupils equal round and reactive, no palptable cervical adenopathy,  CV: RRR, no m/rg, no jvd Resp: Clear to auscultation bilaterally GI: abdomen is soft, non-tender, non-distended, normal bowel sounds, no hepatosplenomegaly MSK: extremities are warm, no edema.  Skin: warm, no rash Neuro:  no focal deficits Psych: appropriate affect   Diagnostic Studies  03/2010 cath Nonobstructive CAD   02/2017 nuclear  stress Inferior/inferolateral defect that improves incompletely consistent with ischemia in inferolateral wall (base, minimally mid) and scar. Coexistent soft tissue attenuation (diaphragm, overlying bowel activity) makes evaluation difficult This is a high risk study. Nuclear stress EF: 38%.   02/2017 echo Study Conclusions   - Left ventricle: The cavity size was normal. Wall thickness was   normal. Systolic function was normal. The estimated ejection   fraction was in the range of 55% to 60%. Wall motion was normal;   there were no regional wall motion abnormalities. Doppler   parameters are consistent with abnormal left ventricular   relaxation (grade 1 diastolic dysfunction). - Aortic valve: Valve area (VTI): 2.74 cm^2. Valve  area (Vmax):   2.42 cm^2. Valve area (Vmean): 2.36 cm^2. - Technically adequate study.   09/2023 monitor   14 day monitor   Rare supraventricular ectopy in the form of isolated PACs, couplets, triplets. 4 runs of SVT longest 15 beats.   Rare ventricular ectopy in the form of isoalted PVCs, couplets.   No symptoms were reported   Assessment and Plan   1. CAD - Prior stress with likely mild inferolateral ischemia.  - CT chest showed evidence of CAD - no recent symptoms, continue current meds   2. HTN - bp is at goal, continue current therapy   3. Hyperlipidemia - lipids at goal, continue current meds  F/u 1 year     Dorn PHEBE Ross, M.D.

## 2024-03-31 NOTE — Patient Instructions (Addendum)

## 2024-04-26 ENCOUNTER — Other Ambulatory Visit: Payer: Self-pay | Admitting: Cardiology

## 2024-06-23 ENCOUNTER — Other Ambulatory Visit

## 2024-06-29 ENCOUNTER — Ambulatory Visit (INDEPENDENT_AMBULATORY_CARE_PROVIDER_SITE_OTHER): Admitting: Urology

## 2024-06-29 VITALS — BP 149/77 | HR 86

## 2024-06-29 DIAGNOSIS — N138 Other obstructive and reflux uropathy: Secondary | ICD-10-CM | POA: Diagnosis not present

## 2024-06-29 DIAGNOSIS — N401 Enlarged prostate with lower urinary tract symptoms: Secondary | ICD-10-CM

## 2024-06-29 DIAGNOSIS — R972 Elevated prostate specific antigen [PSA]: Secondary | ICD-10-CM | POA: Diagnosis not present

## 2024-06-29 DIAGNOSIS — R350 Frequency of micturition: Secondary | ICD-10-CM | POA: Diagnosis not present

## 2024-06-29 MED ORDER — SILODOSIN 8 MG PO CAPS: 8.0000 mg | ORAL_CAPSULE | Freq: Every day | ORAL | 3 refills | Status: AC

## 2024-06-29 MED ORDER — FINASTERIDE 5 MG PO TABS: 5.0000 mg | ORAL_TABLET | Freq: Every day | ORAL | 3 refills | Status: AC

## 2024-06-29 NOTE — Progress Notes (Unsigned)
 06/29/2024 3:20 PM   Bryan Levy [birth year] [id number]  Referring provider: Orpha Yancey LABOR, MD [address] [place],  [place] [address]     HPI: Mr Bryan Levy is a 73yo here for followup for BPh and elevated PSA. No recent PSA. IPSS 13 QOL 2 on rapaflo  8mg  daily. Urine stream is strong. No straining to urinate. He has rare urgency and rare urge incontinence.    PMH: Past Medical History:  Diagnosis Date   Ankle dislocation, left, initial encounter 09/12/2016   Chest pain, unspecified    Closed posterior dislocation of left hip (HCC) 09/12/2016   GERD (gastroesophageal reflux disease)    Heart attack (HCC) 2008   Hyperlipidemia    Hypertension    Intermediate coronary syndrome (HCC) 04/23/2017   pt believes related to panic attack   Personal history of tobacco use, presenting hazards to health    Polysubstance abuse (HCC)    Shortness of breath     Surgical History: Past Surgical History:  Procedure Laterality Date   CATARACT EXTRACTION W/PHACO Right 05/12/2022   Procedure: CATARACT EXTRACTION PHACO AND INTRAOCULAR LENS PLACEMENT (IOC);  Surgeon: Harrie Agent, MD;  Location: [facility];  Service: Ophthalmology;  Laterality: Right;  CDE 9.25   CATARACT EXTRACTION W/PHACO Left 06/06/2022   Procedure: CATARACT EXTRACTION PHACO AND INTRAOCULAR LENS PLACEMENT (IOC);  Surgeon: Harrie Agent, MD;  Location: [facility];  Service: Ophthalmology;  Laterality: Left;  CDE: 9.87   COLONOSCOPY  11/2018   Dr. Maranda: severe diverticulosis with associated muscular hypertrophy. sessile polyp 3-24mm removed from ascending colon (tubular adenoma). next TCS in five year.    ESOPHAGOGASTRODUODENOSCOPY  2018   [facility]: erosive reflux esophagitis, potential MW tear, H.pylori gastritis. Eradication documented per breath test in 2020   IRRIGATION AND DEBRIDEMENT KNEE Left 09/11/2016   Procedure: IRRIGATION AND DEBRIDEMENT LEFT KNEE;  Surgeon: Ozell Bruch, MD;  Location: [facility] OR;  Service:  Orthopedics;  Laterality: Left;   ORIF ACETABULAR FRACTURE Left 09/11/2016   Procedure: OPEN REDUCTION INTERNAL FIXATION (ORIF) ACETABULAR FRACTURE;  Surgeon: Ozell Bruch, MD;  Location: [facility] OR;  Service: Orthopedics;  Laterality: Left;   ORIF ANKLE FRACTURE Left 09/11/2016   Procedure: OPEN REDUCTION INTERNAL FIXATION (ORIF) LEFT ANKLE FRACTURE;  Surgeon: Ozell Bruch, MD;  Location: [facility] OR;  Service: Orthopedics;  Laterality: Left;   TONSILLECTOMY     TOTAL HIP ARTHROPLASTY Left 05/04/2017   Procedure: LEFT TOTAL HIP ARTHROPLASTY ANTERIOR APPROACH;  Surgeon: Liam Lerner, MD;  Location: [facility] OR;  Service: Orthopedics;  Laterality: Left;    Home Medications:  Allergies as of 06/29/2024       Reactions   Lisinopril Swelling        Medication List        Accurate as of June 29, 2024  3:20 PM. If you have any questions, ask your nurse or doctor.          amLODipine  5 MG tablet Commonly known as: NORVASC  Take 5 mg by mouth daily.   Aspirin  81 MG Caps Take 1 tablet by mouth daily.   atorvastatin 80 MG tablet Commonly known as: LIPITOR Take 1 tablet by mouth daily.   Blood Pressure Monitor Devi 1 each by Does not apply route daily.   carvedilol  25 MG tablet Commonly known as: COREG  Take 12.5 mg by mouth 2 (two) times daily with a meal.   ezetimibe  10 MG tablet Commonly known as: ZETIA  TAKE 1 TABLET BY MOUTH DAILY   fluticasone furoate-vilanterol 200-25  MCG/ACT Aepb Commonly known as: BREO ELLIPTA Inhale into the lungs.   gabapentin  300 MG capsule Commonly known as: NEURONTIN  Take 300 mg by mouth 3 (three) times daily.   glipiZIDE 10 MG 24 hr tablet Commonly known as: GLUCOTROL XL Take 1 tablet by mouth daily.   methocarbamol  500 MG tablet Commonly known as: ROBAXIN  Take 500 mg by mouth as needed for muscle spasms.   pantoprazole  40 MG tablet Commonly known as: PROTONIX  Take 1 tablet by mouth daily.   silodosin  8 MG Caps capsule Commonly known as:  RAPAFLO  Take 1 capsule (8 mg total) by mouth at bedtime.   Symbicort 160-4.5 MCG/ACT inhaler Generic drug: budesonide-formoterol   tamsulosin  0.4 MG Caps capsule Commonly known as: FLOMAX  Take 0.4 mg by mouth daily.   valsartan  160 MG tablet Commonly known as: DIOVAN  Take 1 tablet (160 mg total) by mouth daily.        Allergies:  Allergies  Allergen Reactions   Lisinopril Swelling    Family History: Family History  Problem Relation Age of Onset   Hypertension Mother    Hypertension Sister    Coronary artery disease Other        NEGATIVE     Social History:  reports that he has quit smoking. His smoking use included cigarettes. He has a 48 pack-year smoking history. He has never used smokeless tobacco. He reports that he does not currently use alcohol. He reports that he does not use drugs.  ROS: All other review of systems were reviewed and are negative except what is noted above in HPI  Physical Exam: BP (!) 149/77   Pulse 86   Constitutional:  Alert and oriented, No acute distress. HEENT: [place] AT, moist mucus membranes.  Trachea midline, no masses. Cardiovascular: No clubbing, cyanosis, or edema. Respiratory: Normal respiratory effort, no increased work of breathing. GI: Abdomen is soft, nontender, nondistended, no abdominal masses GU: No CVA tenderness.  Lymph: No cervical or inguinal lymphadenopathy. Skin: No rashes, bruises or suspicious lesions. Neurologic: Grossly intact, no focal deficits, moving all 4 extremities. Psychiatric: Normal mood and affect.  Laboratory Data: Lab Results  Component Value Date   WBC 6.0 10/21/2017   HGB 13.3 10/21/2017   HCT 42.3 10/21/2017   MCV 82.6 10/21/2017   PLT 253 10/21/2017    Lab Results  Component Value Date   CREATININE 1.23 10/21/2017    No results found for: PSA  No results found for: TESTOSTERONE  Lab Results  Component Value Date   HGBA1C 7.4 09/23/2021    Urinalysis    Component Value  Date/Time   COLORURINE YELLOW 04/23/2017 1329   APPEARANCEUR Clear 12/23/2023 1427   LABSPEC 1.018 04/23/2017 1329   PHURINE 6.0 04/23/2017 1329   GLUCOSEU Negative 12/23/2023 1427   HGBUR NEGATIVE 04/23/2017 1329   BILIRUBINUR Negative 12/23/2023 1427   KETONESUR NEGATIVE 04/23/2017 1329   PROTEINUR Negative 12/23/2023 1427   PROTEINUR NEGATIVE 04/23/2017 1329   NITRITE Negative 12/23/2023 1427   NITRITE NEGATIVE 04/23/2017 1329   LEUKOCYTESUR Negative 12/23/2023 1427    Lab Results  Component Value Date   LABMICR Comment 12/23/2023    Pertinent Imaging: *** No results found for this or any previous visit.  No results found for this or any previous visit.  No results found for this or any previous visit.  No results found for this or any previous visit.  No results found for this or any previous visit.  No results found for this or  any previous visit.  No results found for this or any previous visit.  No results found for this or any previous visit.   Assessment & Plan:    1. Elevated PSA (Primary) ***  2. Benign prostatic hyperplasia with urinary obstruction *** - Urinalysis, Routine w reflex microscopic  3. Urinary frequency ***   No follow-ups on file.  Belvie Clara, MD  [facility] [place]

## 2024-06-30 LAB — URINALYSIS, ROUTINE W REFLEX MICROSCOPIC
Bilirubin, UA: NEGATIVE
Glucose, UA: NEGATIVE
Ketones, UA: NEGATIVE
Leukocytes,UA: NEGATIVE
Nitrite, UA: NEGATIVE
Protein,UA: NEGATIVE
RBC, UA: NEGATIVE
Specific Gravity, UA: 1.02 (ref 1.005–1.030)
Urobilinogen, Ur: 1 mg/dL (ref 0.2–1.0)
pH, UA: 6 (ref 5.0–7.5)

## 2024-06-30 LAB — PSA: Prostate Specific Ag, Serum: 4.2 ng/mL — ABNORMAL HIGH (ref 0.0–4.0)

## 2024-07-12 ENCOUNTER — Encounter: Payer: Self-pay | Admitting: Urology

## 2024-07-12 NOTE — Patient Instructions (Signed)

## 2024-12-29 ENCOUNTER — Other Ambulatory Visit

## 2025-01-04 ENCOUNTER — Ambulatory Visit: Admitting: Urology
